# Patient Record
Sex: Female | Born: 1940 | Race: White | Hispanic: No | Smoking: Never smoker
Health system: Southern US, Community
[De-identification: ages and names within clinical notes are randomized; demographics above are authoritative.]

## PROBLEM LIST (undated history)

## (undated) DIAGNOSIS — H269 Unspecified cataract: Secondary | ICD-10-CM

## (undated) DIAGNOSIS — N189 Chronic kidney disease, unspecified: Secondary | ICD-10-CM

## (undated) DIAGNOSIS — G250 Essential tremor: Secondary | ICD-10-CM

## (undated) DIAGNOSIS — M1990 Unspecified osteoarthritis, unspecified site: Secondary | ICD-10-CM

## (undated) DIAGNOSIS — F419 Anxiety disorder, unspecified: Secondary | ICD-10-CM

## (undated) DIAGNOSIS — E785 Hyperlipidemia, unspecified: Secondary | ICD-10-CM

## (undated) HISTORY — DX: Unspecified cataract: H26.9

## (undated) HISTORY — PX: COLONOSCOPY: SHX174

## (undated) HISTORY — PX: OTHER SURGICAL HISTORY: SHX169

## (undated) HISTORY — DX: Hyperlipidemia, unspecified: E78.5

## (undated) HISTORY — PX: CHOLECYSTECTOMY: SHX55

## (undated) HISTORY — DX: Anxiety disorder, unspecified: F41.9

## (undated) HISTORY — PX: TONSILLECTOMY: SUR1361

## (undated) HISTORY — DX: Unspecified osteoarthritis, unspecified site: M19.90

## (undated) HISTORY — DX: Essential tremor: G25.0

---

## 1999-05-30 ENCOUNTER — Other Ambulatory Visit: Admission: RE | Admit: 1999-05-30 | Discharge: 1999-05-30 | Payer: Self-pay | Admitting: Gynecology

## 2002-09-15 ENCOUNTER — Other Ambulatory Visit: Admission: RE | Admit: 2002-09-15 | Discharge: 2002-09-15 | Payer: Self-pay | Admitting: Gynecology

## 2006-10-29 ENCOUNTER — Ambulatory Visit: Payer: Self-pay | Admitting: Endocrinology

## 2006-10-29 LAB — CONVERTED CEMR LAB
Basophils Absolute: 0.3 10*3/uL — ABNORMAL HIGH (ref 0.0–0.1)
Hemoglobin: 12.9 g/dL (ref 12.0–15.0)
INR: 0.9 (ref 0.8–1.0)
MCHC: 34.2 g/dL (ref 30.0–36.0)
Monocytes Absolute: 0.5 10*3/uL (ref 0.2–0.7)
Monocytes Relative: 6.1 % (ref 3.0–11.0)
Neutro Abs: 5.2 10*3/uL (ref 1.4–7.7)
RDW: 12.6 % (ref 11.5–14.6)

## 2006-11-20 ENCOUNTER — Ambulatory Visit: Payer: Self-pay | Admitting: Internal Medicine

## 2006-12-04 ENCOUNTER — Ambulatory Visit: Payer: Self-pay | Admitting: Internal Medicine

## 2007-03-29 LAB — CONVERTED CEMR LAB: Pap Smear: NORMAL

## 2007-12-08 ENCOUNTER — Ambulatory Visit: Payer: Self-pay | Admitting: Endocrinology

## 2008-10-03 ENCOUNTER — Ambulatory Visit: Payer: Self-pay | Admitting: Endocrinology

## 2008-10-03 DIAGNOSIS — R638 Other symptoms and signs concerning food and fluid intake: Secondary | ICD-10-CM | POA: Insufficient documentation

## 2008-10-05 LAB — CONVERTED CEMR LAB
Calcium: 8.7 mg/dL (ref 8.4–10.5)
Chloride: 103 meq/L (ref 96–112)
Creatinine, Ser: 0.7 mg/dL (ref 0.4–1.2)
GFR calc non Af Amer: 88.45 mL/min (ref >60)

## 2013-05-26 DIAGNOSIS — F330 Major depressive disorder, recurrent, mild: Secondary | ICD-10-CM | POA: Diagnosis not present

## 2013-06-30 DIAGNOSIS — N816 Rectocele: Secondary | ICD-10-CM | POA: Diagnosis not present

## 2013-06-30 DIAGNOSIS — Z1231 Encounter for screening mammogram for malignant neoplasm of breast: Secondary | ICD-10-CM | POA: Diagnosis not present

## 2013-06-30 DIAGNOSIS — Z1212 Encounter for screening for malignant neoplasm of rectum: Secondary | ICD-10-CM | POA: Diagnosis not present

## 2013-06-30 DIAGNOSIS — N8111 Cystocele, midline: Secondary | ICD-10-CM | POA: Diagnosis not present

## 2013-10-28 ENCOUNTER — Ambulatory Visit (INDEPENDENT_AMBULATORY_CARE_PROVIDER_SITE_OTHER): Payer: Medicare Other

## 2013-10-28 DIAGNOSIS — Z23 Encounter for immunization: Secondary | ICD-10-CM | POA: Diagnosis not present

## 2014-02-09 DIAGNOSIS — H5203 Hypermetropia, bilateral: Secondary | ICD-10-CM | POA: Diagnosis not present

## 2014-02-09 DIAGNOSIS — H2513 Age-related nuclear cataract, bilateral: Secondary | ICD-10-CM | POA: Diagnosis not present

## 2014-02-09 DIAGNOSIS — H25013 Cortical age-related cataract, bilateral: Secondary | ICD-10-CM | POA: Diagnosis not present

## 2014-06-01 DIAGNOSIS — F331 Major depressive disorder, recurrent, moderate: Secondary | ICD-10-CM | POA: Diagnosis not present

## 2014-07-12 DIAGNOSIS — Z1289 Encounter for screening for malignant neoplasm of other sites: Secondary | ICD-10-CM | POA: Diagnosis not present

## 2014-07-12 DIAGNOSIS — Z1231 Encounter for screening mammogram for malignant neoplasm of breast: Secondary | ICD-10-CM | POA: Diagnosis not present

## 2014-07-13 ENCOUNTER — Other Ambulatory Visit: Payer: Self-pay | Admitting: Obstetrics & Gynecology

## 2014-07-13 DIAGNOSIS — E2839 Other primary ovarian failure: Secondary | ICD-10-CM

## 2014-07-17 ENCOUNTER — Ambulatory Visit
Admission: RE | Admit: 2014-07-17 | Discharge: 2014-07-17 | Disposition: A | Discharge status: Home / Self Care | Payer: Medicare Other | Source: Ambulatory Visit | Attending: Obstetrics & Gynecology | Admitting: Obstetrics & Gynecology

## 2014-07-17 DIAGNOSIS — M85852 Other specified disorders of bone density and structure, left thigh: Secondary | ICD-10-CM | POA: Diagnosis not present

## 2014-07-17 DIAGNOSIS — E2839 Other primary ovarian failure: Secondary | ICD-10-CM

## 2014-08-01 ENCOUNTER — Ambulatory Visit (INDEPENDENT_AMBULATORY_CARE_PROVIDER_SITE_OTHER): Payer: Medicare Other

## 2014-08-01 DIAGNOSIS — Z23 Encounter for immunization: Secondary | ICD-10-CM

## 2014-09-05 DIAGNOSIS — M47816 Spondylosis without myelopathy or radiculopathy, lumbar region: Secondary | ICD-10-CM | POA: Diagnosis not present

## 2014-09-05 DIAGNOSIS — M170 Bilateral primary osteoarthritis of knee: Secondary | ICD-10-CM | POA: Diagnosis not present

## 2014-10-09 ENCOUNTER — Encounter: Payer: Self-pay | Admitting: Endocrinology

## 2014-10-09 ENCOUNTER — Other Ambulatory Visit (INDEPENDENT_AMBULATORY_CARE_PROVIDER_SITE_OTHER): Payer: Medicare Other

## 2014-10-09 ENCOUNTER — Ambulatory Visit (INDEPENDENT_AMBULATORY_CARE_PROVIDER_SITE_OTHER): Payer: Medicare Other | Admitting: Endocrinology

## 2014-10-09 VITALS — BP 128/78 | HR 81 | Temp 97.6°F | Ht 61.0 in | Wt 177.0 lb

## 2014-10-09 DIAGNOSIS — F329 Major depressive disorder, single episode, unspecified: Secondary | ICD-10-CM

## 2014-10-09 DIAGNOSIS — M810 Age-related osteoporosis without current pathological fracture: Secondary | ICD-10-CM | POA: Diagnosis not present

## 2014-10-09 DIAGNOSIS — F32A Depression, unspecified: Secondary | ICD-10-CM

## 2014-10-09 DIAGNOSIS — M170 Bilateral primary osteoarthritis of knee: Secondary | ICD-10-CM

## 2014-10-09 DIAGNOSIS — K589 Irritable bowel syndrome without diarrhea: Secondary | ICD-10-CM

## 2014-10-09 DIAGNOSIS — Z0000 Encounter for general adult medical examination without abnormal findings: Secondary | ICD-10-CM | POA: Diagnosis not present

## 2014-10-09 DIAGNOSIS — E785 Hyperlipidemia, unspecified: Secondary | ICD-10-CM

## 2014-10-09 DIAGNOSIS — M1990 Unspecified osteoarthritis, unspecified site: Secondary | ICD-10-CM | POA: Insufficient documentation

## 2014-10-09 DIAGNOSIS — Z0189 Encounter for other specified special examinations: Secondary | ICD-10-CM

## 2014-10-09 LAB — LIPID PANEL
CHOLESTEROL: 201 mg/dL — AB (ref 0–200)
HDL: 64.6 mg/dL (ref >39.00)
LDL Cholesterol: 117 mg/dL — ABNORMAL HIGH (ref 0–99)
NONHDL: 136.23
Total CHOL/HDL Ratio: 3
Triglycerides: 95 mg/dL (ref 0.0–149.0)
VLDL: 19 mg/dL (ref 0.0–40.0)

## 2014-10-09 LAB — BASIC METABOLIC PANEL
BUN: 11 mg/dL (ref 6–23)
CALCIUM: 8.9 mg/dL (ref 8.4–10.5)
CHLORIDE: 107 meq/L (ref 96–112)
CO2: 27 mEq/L (ref 19–32)
CREATININE: 0.62 mg/dL (ref 0.40–1.20)
GFR: 100.01 mL/min (ref >60.00)
Glucose, Bld: 96 mg/dL (ref 70–99)
Potassium: 3.9 mEq/L (ref 3.5–5.1)
Sodium: 141 mEq/L (ref 135–145)

## 2014-10-09 LAB — VITAMIN D 25 HYDROXY (VIT D DEFICIENCY, FRACTURES): VITD: 24.18 ng/mL — ABNORMAL LOW (ref 30.00–100.00)

## 2014-10-09 LAB — TSH: TSH: 2.67 u[IU]/mL (ref 0.35–4.50)

## 2014-10-09 NOTE — Progress Notes (Signed)
we discussed code status.  pt requests full code, but would not want to be started or maintained on artificial life-support measures if there was not a reasonable chance of recovery 

## 2014-10-09 NOTE — Progress Notes (Signed)
Subjective:    Patient ID: Brittney Miller, female    DOB: Mar 07, 1940, 74 y.o.   MRN: [id number]  HPI  Pot states many years of intermittent slight cramping throughout the abdomen, but no assoc brbpr.  She has been dx'ed with IBS Dyslipidemia: denies chest pain. She has lost a few lbs, due to her efforts. Depression: pt says this is well-controlled on prozac.   No past medical history on file.  No past surgical history on file.  Social History   Social History  . Marital Status: Married    Spouse Name: N/A  . Number of Children: N/A  . Years of Education: N/A   Occupational History  . Not on file.   Social History Main Topics  . Smoking status: Unknown If Ever Smoked  . Smokeless tobacco: Not on file  . Alcohol Use: Not on file  . Drug Use: Not on file  . Sexual Activity: Not on file   Other Topics Concern  . Not on file   Social History Narrative  . No narrative on file    No current outpatient prescriptions on file prior to visit.   No current facility-administered medications on file prior to visit.    Not on File  No family history on file.  BP 128/78 mmHg  Pulse 81  Temp(Src) 97.6 F (36.4 C) (Oral)  Ht 5\' 1"  (1.549 m)  Wt 177 lb (80.287 kg)  BMI 33.46 kg/m2  SpO2 94%  LMP  (LMP Unknown)  Review of Systems Denies weight change and sob.      Objective:   Physical Exam VITAL SIGNS:  See vs page GENERAL: no distress NECK: There is no palpable thyroid enlargement.  No thyroid nodule is palpable.  No palpable lymphadenopathy at the anterior neck. LUNGS:  Clear to auscultation HEART:  Regular rate and rhythm without murmurs noted. Normal S1,S2.   Gait: normal and steady.   i personally reviewed electrocardiogram tracing: normal Lab Results  Component Value Date   CHOL 201* 10/09/2014   HDL 64.60 10/09/2014   LDLCALC 117* 10/09/2014   TRIG 95.0 10/09/2014   CHOLHDL 3 10/09/2014      Assessment & Plan:  IBS, new to me: i offered rx.  Pt  says she'll consider Dyslipidemia: she should take medication. Depression: well-controlled.  Please continue the same medication.   Subjective:   Patient here for Medicare annual wellness visit and management of other chronic and acute problems.     Risk factors: advanced age.    Roster of Physicians Providing Medical Care to Patient:  See "snapshot"   Activities of Daily Living: In your present state of health, do you have any difficulty performing the following activities?:  Preparing food and eating?: No  Bathing yourself: No  Getting dressed: No  Using the toilet: No  Moving around from place to place: No  In the past year have you fallen or had a near fall?: No    Home Safety: Has smoke detector and wears seat belts. No firearms. No excess sun exposure.  Diet and Exercise  Current exercise habits: pt says "ok." Dietary issues discussed: pt reports a healthy diet   Depression Screen  Q1: Over the past two weeks, have you felt down, depressed or hopeless? no Q2: Over the past two weeks, have you felt little interest or pleasure in doing things? no   The following portions of the patient's history were reviewed and updated as appropriate: allergies, current medications, past family  history, past medical history, past social history, past surgical history and problem list.   Review of Systems  Denies hearing loss, and visual loss Objective:   Vision:  Advertising account executive, but does not recall name Hearing: grossly normal Body mass index:  See vs page Msk: pt easily and quickly performs "get-up-and-go" from a sitting position Cognitive Impairment Assessment: cognition, memory and judgment appear normal.  remembers 3/3 at 5 minutes.  excellent recall.  can easily read and write a sentence.  alert and oriented x 3.     Assessment:   Medicare wellness utd on preventive parameters    Plan:   During the course of the visit the patient was educated and counseled about  appropriate screening and preventive services including:        Fall prevention   Screening mammography--GYN does Bone densitometry screening--GYN does  Diabetes screening  Nutrition counseling   Vaccines / LABS Zostavax / Pneumococcal Vaccine  today   Patient Instructions (the written plan) was given to the patient.

## 2014-10-09 NOTE — Patient Instructions (Addendum)
please consider these measures for your health:  minimize alcohol.  do not use tobacco products.  have a colonoscopy at least every 10 years from age 74.  Women should have an annual mammogram from age 40.  keep firearms safely stored.  always use seat belts.  have working smoke alarms in your home.  see an eye doctor and dentist regularly.  never drive under the influence of alcohol or drugs (including prescription drugs).  those with fair skin should take precautions against the sun. please let me know what your wishes would be, if artificial life support measures should become necessary.  it is critically important to prevent falling down (keep floor areas well-lit, dry, and free of loose objects.  If you have a cane, walker, or wheelchair, you should use it, even for short trips around the house.  Also, try not to rush). blood tests are requested for you today.  We'll let you know about the results. good diet and exercise significantly improve your health.  please let me know if you wish to be referred to a dietician.  high blood sugar is very risky to your health.  you should see an eye doctor and dentist every year.  It is very important to get all recommended vaccinations.  Please return in 1 year.   Please let me know if you want medication for the stomach cramps.

## 2014-10-10 LAB — PTH, INTACT AND CALCIUM
CALCIUM: 9 mg/dL (ref 8.4–10.5)
PTH: 46 pg/mL (ref 14–64)

## 2014-11-01 ENCOUNTER — Ambulatory Visit (INDEPENDENT_AMBULATORY_CARE_PROVIDER_SITE_OTHER): Payer: Medicare Other

## 2014-11-01 DIAGNOSIS — Z23 Encounter for immunization: Secondary | ICD-10-CM | POA: Diagnosis not present

## 2015-01-17 ENCOUNTER — Encounter (HOSPITAL_COMMUNITY): Payer: Self-pay | Admitting: Emergency Medicine

## 2015-01-17 ENCOUNTER — Emergency Department (INDEPENDENT_AMBULATORY_CARE_PROVIDER_SITE_OTHER)
Admission: EM | Admit: 2015-01-17 | Discharge: 2015-01-17 | Disposition: A | Discharge status: Home / Self Care | Payer: Medicare Other | Source: Home / Self Care

## 2015-01-17 DIAGNOSIS — S61512A Laceration without foreign body of left wrist, initial encounter: Secondary | ICD-10-CM

## 2015-01-17 MED ORDER — PREDNISONE 10 MG PO TABS: 30.0000 mg | ORAL_TABLET | Freq: Every day | ORAL | Status: DC

## 2015-01-17 NOTE — ED Provider Notes (Signed)
CSN: [id number]     Arrival date & time 01/17/15  1734 History   None    Chief Complaint  Patient presents with  . Laceration   (Consider location/radiation/quality/duration/timing/severity/associated sxs/prior Treatment) The history is provided by the patient. No language interpreter was used.  History obtained from patient:   LOCATION: Left wrist SEVERITY: None DURATION: Less than one hour CONTEXT: Cutting [place] QUALITY: MODIFYING FACTORS: Wound cleaned at home ASSOCIATED SYMPTOMS: None TIMING: Constant     History reviewed. No pertinent past medical history. History reviewed. No pertinent past surgical history. History reviewed. No pertinent family history. Social History  Substance Use Topics  . Smoking status: Unknown If Ever Smoked  . Smokeless tobacco: None  . Alcohol Use: None   OB History    No data available     Review of Systems Positive for wrist laceration Negative for chest pain, shortness of breath, neurologic symptoms. Allergies  Review of patient's allergies indicates no known allergies.  Home Medications   Prior to Admission medications   Medication Sig Start Date End Date Taking? Authorizing Provider  FLUoxetine (PROZAC) 20 MG capsule  08/22/14   Historical Provider, MD  predniSONE (DELTASONE) 10 MG tablet Take 3 tablets (30 mg total) by mouth daily. 01/17/15   Konrad Felix, PA   Meds Ordered and Administered this Visit  Medications - No data to display  BP 154/83 mmHg  Pulse 69  Temp(Src) 99.7 F (37.6 C) (Oral)  Resp 16  SpO2 96%  LMP  (LMP Unknown) No data found.   Physical Exam  Constitutional: She appears well-developed and well-nourished.  Musculoskeletal: She exhibits tenderness.       Arms: Nursing note and vitals reviewed.   ED Course  Procedures (including critical care time) Wound glue and Steri-Strips were placed over the wound. Sterile dressing was placed by nursing staff. Labs Review Labs Reviewed - No data  to display  Imaging Review No results found.   Visual Acuity Review  Right Eye Distance:   Left Eye Distance:   Bilateral Distance:    Right Eye Near:   Left Eye Near:    Bilateral Near:         MDM   1. Wrist laceration, left, initial encounter    Instructions of care provided discharged home in stable condition    Konrad Felix, [place] 01/17/15 2056

## 2015-01-17 NOTE — Discharge Instructions (Signed)
Laceration Care, Adult A laceration is a cut that goes through all layers of the skin. The cut also goes into the tissue that is right under the skin. Some cuts heal on their own. Others need to be closed with stitches (sutures), staples, skin adhesive strips, or wound glue. Taking care of your cut lowers your risk of infection and helps your cut to heal better. HOW TO TAKE CARE OF YOUR CUT For stitches or staples:  Keep the wound clean and dry.  If you were given a bandage (dressing), you should change it at least one time per day or as told by your doctor. You should also change it if it gets wet or dirty.  Keep the wound completely dry for the first 24 hours or as told by your doctor. After that time, you may take a shower or a bath. However, make sure that the wound is not soaked in water until after the stitches or staples have been removed.  Clean the wound one time each day or as told by your doctor:  Wash the wound with soap and water.  Rinse the wound with water until all of the soap comes off.  Pat the wound dry with a clean towel. Do not rub the wound.  After you clean the wound, put a thin layer of antibiotic ointment on it as told by your doctor. This ointment:  Helps to prevent infection.  Keeps the bandage from sticking to the wound.  Have your stitches or staples removed as told by your doctor. If your doctor used skin adhesive strips:   Keep the wound clean and dry.  If you were given a bandage, you should change it at least one time per day or as told by your doctor. You should also change it if it gets dirty or wet.  Do not get the skin adhesive strips wet. You can take a shower or a bath, but be careful to keep the wound dry.  If the wound gets wet, pat it dry with a clean towel. Do not rub the wound.  Skin adhesive strips fall off on their own. You can trim the strips as the wound heals. Do not remove any strips that are still stuck to the wound. They will  fall off after a while. If your doctor used wound glue:  Try to keep your wound dry, but you may briefly wet it in the shower or bath. Do not soak the wound in water, such as by swimming.  After you take a shower or a bath, gently pat the wound dry with a clean towel. Do not rub the wound.  Do not do any activities that will make you really sweaty until the skin glue has fallen off on its own.  Do not apply liquid, cream, or ointment medicine to your wound while the skin glue is still on.  If you were given a bandage, you should change it at least one time per day or as told by your doctor. You should also change it if it gets dirty or wet.  If a bandage is placed over the wound, do not let the tape for the bandage touch the skin glue.  Do not pick at the glue. The skin glue usually stays on for 5-10 days. Then, it falls off of the skin. General Instructions  To help prevent scarring, make sure to cover your wound with sunscreen whenever you are outside after stitches are removed, after adhesive strips are removed,  or when wound glue stays in place and the wound is healed. Make sure to wear a sunscreen of at least 30 SPF.  Take over-the-counter and prescription medicines only as told by your doctor.  If you were given antibiotic medicine or ointment, take or apply it as told by your doctor. Do not stop using the antibiotic even if your wound is getting better.  Do not scratch or pick at the wound.  Keep all follow-up visits as told by your doctor. This is important.  Check your wound every day for signs of infection. Watch for:  Redness, swelling, or pain.  Fluid, blood, or pus.  Raise (elevate) the injured area above the level of your heart while you are sitting or lying down, if possible. GET HELP IF:  You got a tetanus shot and you have any of these problems at the injection site:  Swelling.  Very bad pain.  Redness.  Bleeding.  You have a fever.  A wound that was  closed breaks open.  You notice a bad smell coming from your wound or your bandage.  You notice something coming out of the wound, such as wood or glass.  Medicine does not help your pain.  You have more redness, swelling, or pain at the site of your wound.  You have fluid, blood, or pus coming from your wound.  You notice a change in the color of your skin near your wound.  You need to change the bandage often because fluid, blood, or pus is coming from the wound.  You start to have a new rash.  You start to have numbness around the wound. GET HELP RIGHT AWAY IF:  You have very bad swelling around the wound.  Your pain suddenly gets worse and is very bad.  You notice painful lumps near the wound or on skin that is anywhere on your body.  You have a red streak going away from your wound.  The wound is on your hand or foot and you cannot move a finger or toe like you usually can.  The wound is on your hand or foot and you notice that your fingers or toes look pale or bluish.   This information is not intended to replace advice given to you by your health care provider. Make sure you discuss any questions you have with your health care provider.   Document Released: 07/30/2007 Document Revised: 06/27/2014 Document Reviewed: 02/06/2014 Elsevier Interactive Patient Education 2016 [place]. Sterile Tape Wound Care Some cuts and wounds can be closed using sterile tape, also called skin adhesive strips. Skin adhesive strips can be used for shallow (superficial) and simple cuts, wounds, lacerations, and surgical incisions. These strips act in place of stitches to hold the edges of the wound together, allowing for faster healing. Unlike stitches, the adhesive strips do not require needles or anesthetic medicine for placement. The strips will wear off naturally as the wound is healing. It is important to take proper care of your wound at home while it heals.  HOME CARE  INSTRUCTIONS  Try to keep the area around your wound clean and dry. Do not allow the adhesive strips to get wet for the first 12 hours.   Do not use any soaps or ointments on the wound for the first 12 hours.   If a bandage (dressing) has been applied, follow your health care provider's instructions for how often to change the dressing. Keep the dressing dry if one has been applied.  Do not remove the adhesive strips. They will fall off on their own. If they do not, you may remove them gently after 10 days. You should gently wet the strips before removing them. For example, this can be done in the shower.  Do not scratch, pick, or rub the wound area.   Protect the wound from further injury until it is healed.   Protect the wound from sun and tanning bed exposure while it is healing and for several weeks after healing.   Only take over-the-counter or prescription medicines as directed by your health care provider.   Keep all follow-up appointments as directed by your health care provider.  SEEK MEDICAL CARE IF: Your adhesive strips become wet or soaked with blood before the wound has healed. The tape will need to be replaced.  SEEK IMMEDIATE MEDICAL CARE IF:  You have increasing pain in the wound.   You develop a rash after the strips are applied.  Your wound becomes red, swollen, hot, or tender.   You have a red streak that goes away from the wound.   You have pus coming from the wound.   You have increased bleeding from the wound.  You notice a bad smell coming from the wound.   Your wound breaks open. MAKE SURE YOU:  Understand these instructions.  Will watch your condition.  Will get help right away if you are not doing well or get worse.   This information is not intended to replace advice given to you by your health care provider. Make sure you discuss any questions you have with your health care provider.   Document Released: 03/20/2004 Document  Revised: 03/03/2014 Document Reviewed: 09/01/2012 Elsevier Interactive Patient Education [employer].

## 2015-01-17 NOTE — ED Notes (Signed)
The patient presented to the [facility] with a complaint of a laceration to her left wrist that happened when she was cutting food.

## 2015-03-19 DIAGNOSIS — H2513 Age-related nuclear cataract, bilateral: Secondary | ICD-10-CM | POA: Diagnosis not present

## 2015-03-19 DIAGNOSIS — Z0100 Encounter for examination of eyes and vision without abnormal findings: Secondary | ICD-10-CM | POA: Diagnosis not present

## 2015-03-19 DIAGNOSIS — H25013 Cortical age-related cataract, bilateral: Secondary | ICD-10-CM | POA: Diagnosis not present

## 2015-06-06 DIAGNOSIS — F331 Major depressive disorder, recurrent, moderate: Secondary | ICD-10-CM | POA: Diagnosis not present

## 2015-10-10 DIAGNOSIS — F331 Major depressive disorder, recurrent, moderate: Secondary | ICD-10-CM | POA: Diagnosis not present

## 2015-11-07 DIAGNOSIS — Z23 Encounter for immunization: Secondary | ICD-10-CM | POA: Diagnosis not present

## 2016-04-28 DIAGNOSIS — H5203 Hypermetropia, bilateral: Secondary | ICD-10-CM | POA: Diagnosis not present

## 2016-04-28 DIAGNOSIS — H2513 Age-related nuclear cataract, bilateral: Secondary | ICD-10-CM | POA: Diagnosis not present

## 2016-04-28 DIAGNOSIS — H25013 Cortical age-related cataract, bilateral: Secondary | ICD-10-CM | POA: Diagnosis not present

## 2016-05-15 DIAGNOSIS — F331 Major depressive disorder, recurrent, moderate: Secondary | ICD-10-CM | POA: Diagnosis not present

## 2016-06-24 DIAGNOSIS — H2511 Age-related nuclear cataract, right eye: Secondary | ICD-10-CM | POA: Diagnosis not present

## 2016-06-24 DIAGNOSIS — H25011 Cortical age-related cataract, right eye: Secondary | ICD-10-CM | POA: Diagnosis not present

## 2016-06-24 DIAGNOSIS — H25811 Combined forms of age-related cataract, right eye: Secondary | ICD-10-CM | POA: Diagnosis not present

## 2016-07-16 ENCOUNTER — Other Ambulatory Visit: Payer: Self-pay | Admitting: Obstetrics & Gynecology

## 2016-07-16 DIAGNOSIS — L900 Lichen sclerosus et atrophicus: Secondary | ICD-10-CM | POA: Diagnosis not present

## 2016-07-16 DIAGNOSIS — Z1231 Encounter for screening mammogram for malignant neoplasm of breast: Secondary | ICD-10-CM | POA: Diagnosis not present

## 2016-07-16 DIAGNOSIS — N816 Rectocele: Secondary | ICD-10-CM | POA: Diagnosis not present

## 2016-07-16 DIAGNOSIS — E2839 Other primary ovarian failure: Secondary | ICD-10-CM

## 2016-07-25 ENCOUNTER — Other Ambulatory Visit: Payer: Medicare Other

## 2016-07-29 DIAGNOSIS — H2512 Age-related nuclear cataract, left eye: Secondary | ICD-10-CM | POA: Diagnosis not present

## 2016-07-29 DIAGNOSIS — H25812 Combined forms of age-related cataract, left eye: Secondary | ICD-10-CM | POA: Diagnosis not present

## 2016-07-29 DIAGNOSIS — H25012 Cortical age-related cataract, left eye: Secondary | ICD-10-CM | POA: Diagnosis not present

## 2016-11-12 DIAGNOSIS — Z23 Encounter for immunization: Secondary | ICD-10-CM | POA: Diagnosis not present

## 2016-12-29 ENCOUNTER — Encounter: Payer: Self-pay | Admitting: Gastroenterology

## 2017-06-19 ENCOUNTER — Telehealth: Payer: Self-pay

## 2017-06-19 NOTE — Telephone Encounter (Signed)
Last ov was 10/09/14- patient is requesting an appointment to have neck pain looked at and have a CPE is it ok to schedule her please advise

## 2017-06-19 NOTE — Telephone Encounter (Signed)
OK next available.

## 2017-06-22 NOTE — Telephone Encounter (Signed)
I left detailed VM for patient to call back to make f/u appt & CPE.

## 2017-07-08 ENCOUNTER — Ambulatory Visit
Admission: RE | Admit: 2017-07-08 | Discharge: 2017-07-08 | Disposition: A | Discharge status: Home / Self Care | Payer: Medicare Other | Source: Ambulatory Visit | Attending: Obstetrics & Gynecology | Admitting: Obstetrics & Gynecology

## 2017-07-08 DIAGNOSIS — Z780 Asymptomatic menopausal state: Secondary | ICD-10-CM | POA: Diagnosis not present

## 2017-07-08 DIAGNOSIS — M8589 Other specified disorders of bone density and structure, multiple sites: Secondary | ICD-10-CM | POA: Diagnosis not present

## 2017-07-08 DIAGNOSIS — E2839 Other primary ovarian failure: Secondary | ICD-10-CM

## 2017-07-09 ENCOUNTER — Ambulatory Visit (INDEPENDENT_AMBULATORY_CARE_PROVIDER_SITE_OTHER): Payer: Medicare Other | Admitting: Endocrinology

## 2017-07-09 ENCOUNTER — Encounter: Payer: Self-pay | Admitting: Endocrinology

## 2017-07-09 VITALS — BP 122/70 | HR 65 | Ht 61.0 in | Wt 162.0 lb

## 2017-07-09 DIAGNOSIS — M810 Age-related osteoporosis without current pathological fracture: Secondary | ICD-10-CM

## 2017-07-09 DIAGNOSIS — F329 Major depressive disorder, single episode, unspecified: Secondary | ICD-10-CM | POA: Diagnosis not present

## 2017-07-09 DIAGNOSIS — Z0000 Encounter for general adult medical examination without abnormal findings: Secondary | ICD-10-CM | POA: Diagnosis not present

## 2017-07-09 DIAGNOSIS — F32A Depression, unspecified: Secondary | ICD-10-CM

## 2017-07-09 DIAGNOSIS — M542 Cervicalgia: Secondary | ICD-10-CM | POA: Diagnosis not present

## 2017-07-09 DIAGNOSIS — R4789 Other speech disturbances: Secondary | ICD-10-CM | POA: Insufficient documentation

## 2017-07-09 DIAGNOSIS — E785 Hyperlipidemia, unspecified: Secondary | ICD-10-CM

## 2017-07-09 DIAGNOSIS — Z0001 Encounter for general adult medical examination with abnormal findings: Secondary | ICD-10-CM | POA: Diagnosis not present

## 2017-07-09 DIAGNOSIS — Z23 Encounter for immunization: Secondary | ICD-10-CM | POA: Diagnosis not present

## 2017-07-09 DIAGNOSIS — R479 Unspecified speech disturbances: Secondary | ICD-10-CM

## 2017-07-09 LAB — CBC WITH DIFFERENTIAL/PLATELET
Basophils Absolute: 0.1 10*3/uL (ref 0.0–0.1)
Basophils Relative: 1.4 % (ref 0.0–3.0)
EOS PCT: 4 % (ref 0.0–5.0)
Eosinophils Absolute: 0.2 10*3/uL (ref 0.0–0.7)
HEMATOCRIT: 38 % (ref 36.0–46.0)
HEMOGLOBIN: 12.8 g/dL (ref 12.0–15.0)
LYMPHS PCT: 26.8 % (ref 12.0–46.0)
Lymphs Abs: 1.6 10*3/uL (ref 0.7–4.0)
MCHC: 33.7 g/dL (ref 30.0–36.0)
MCV: 90.8 fl (ref 78.0–100.0)
MONOS PCT: 8.4 % (ref 3.0–12.0)
Monocytes Absolute: 0.5 10*3/uL (ref 0.1–1.0)
Neutro Abs: 3.5 10*3/uL (ref 1.4–7.7)
Neutrophils Relative %: 59.4 % (ref 43.0–77.0)
Platelets: 330 10*3/uL (ref 150.0–400.0)
RBC: 4.19 Mil/uL (ref 3.87–5.11)
RDW: 13.6 % (ref 11.5–15.5)
WBC: 5.9 10*3/uL (ref 4.0–10.5)

## 2017-07-09 LAB — LIPID PANEL
CHOL/HDL RATIO: 3
CHOLESTEROL: 201 mg/dL — AB (ref 0–200)
HDL: 69.3 mg/dL (ref >39.00)
LDL CALC: 113 mg/dL — AB (ref 0–99)
NonHDL: 131.21
TRIGLYCERIDES: 92 mg/dL (ref 0.0–149.0)
VLDL: 18.4 mg/dL (ref 0.0–40.0)

## 2017-07-09 LAB — URINALYSIS, ROUTINE W REFLEX MICROSCOPIC
BILIRUBIN URINE: NEGATIVE
KETONES UR: NEGATIVE
Nitrite: NEGATIVE
RBC / HPF: NONE SEEN (ref 0–?)
Specific Gravity, Urine: 1.015 (ref 1.000–1.030)
TOTAL PROTEIN, URINE-UPE24: NEGATIVE
Urine Glucose: NEGATIVE
Urobilinogen, UA: 0.2 (ref 0.0–1.0)
pH: 6 (ref 5.0–8.0)

## 2017-07-09 LAB — HEPATIC FUNCTION PANEL
ALT: 7 U/L (ref 0–35)
AST: 11 U/L (ref 0–37)
Albumin: 3.9 g/dL (ref 3.5–5.2)
Alkaline Phosphatase: 70 U/L (ref 39–117)
Bilirubin, Direct: 0.1 mg/dL (ref 0.0–0.3)
Total Bilirubin: 0.3 mg/dL (ref 0.2–1.2)
Total Protein: 7 g/dL (ref 6.0–8.3)

## 2017-07-09 LAB — TSH: TSH: 3.72 u[IU]/mL (ref 0.35–4.50)

## 2017-07-09 LAB — BASIC METABOLIC PANEL
BUN: 14 mg/dL (ref 6–23)
CHLORIDE: 103 meq/L (ref 96–112)
CO2: 28 meq/L (ref 19–32)
Calcium: 8.8 mg/dL (ref 8.4–10.5)
Creatinine, Ser: 0.6 mg/dL (ref 0.40–1.20)
GFR: 103.1 mL/min (ref >60.00)
Glucose, Bld: 97 mg/dL (ref 70–99)
POTASSIUM: 4 meq/L (ref 3.5–5.1)
SODIUM: 137 meq/L (ref 135–145)

## 2017-07-09 LAB — VITAMIN D 25 HYDROXY (VIT D DEFICIENCY, FRACTURES): VITD: 43.33 ng/mL (ref 30.00–100.00)

## 2017-07-09 NOTE — Progress Notes (Signed)
we discussed code status.  pt requests full code, but would not want to be started or maintained on artificial life-support measures if there was not a reasonable chance of recovery 

## 2017-07-09 NOTE — Progress Notes (Signed)
Subjective:    Patient ID: Brittney Miller, female    DOB: 1940/06/26, 77 y.o.   MRN: [id number]  HPI Pt reports few years of intermitt moderate pain at the right post neck.  No assoc numbness Past Medical History:  Diagnosis Date  . Dyslipidemia     No past surgical history on file.  Social History   Socioeconomic History  . Marital status: Married    Spouse name: Not on file  . Number of children: Not on file  . Years of education: Not on file  . Highest education level: Not on file  Occupational History  . Not on file  Social Needs  . Financial resource strain: Not on file  . Food insecurity:    Worry: Not on file    Inability: Not on file  . Transportation needs:    Medical: Not on file    Non-medical: Not on file  Tobacco Use  . Smoking status: Unknown If Ever Smoked  . Smokeless tobacco: Never Used  Substance and Sexual Activity  . Alcohol use: Not on file  . Drug use: Not on file  . Sexual activity: Not on file  Lifestyle  . Physical activity:    Days per week: Not on file    Minutes per session: Not on file  . Stress: Not on file  Relationships  . Social connections:    Talks on phone: Not on file    Gets together: Not on file    Attends religious service: Not on file    Active member of club or organization: Not on file    Attends meetings of clubs or organizations: Not on file    Relationship status: Not on file  . Intimate partner violence:    Fear of current or ex partner: Not on file    Emotionally abused: Not on file    Physically abused: Not on file    Forced sexual activity: Not on file  Other Topics Concern  . Not on file  Social History Narrative  . Not on file    Current Outpatient Medications on File Prior to Visit  Medication Sig Dispense Refill  . FLUoxetine (PROZAC) 20 MG capsule      No current facility-administered medications on file prior to visit.     No Known Allergies  No family history on file.  BP 122/70    Pulse 65   Ht 5\' 1"  (1.549 m)   Wt 162 lb (73.5 kg)   LMP  (LMP Unknown)   SpO2 97%   BMI 30.61 kg/m   Review of Systems Denies headache.  She has intermitt difficulty with word finding    Objective:   Physical Exam VS: see vs page GEN: no distress HEAD: head: no deformity eyes: no periorbital swelling, no proptosis external nose and ears are normal mouth: no lesion seen NECK: supple, thyroid is not enlarged.  Full ROM without pain.  CHEST WALL: no deformity LUNGS: clear to auscultation CV: reg rate and rhythm, no murmur MUSCULOSKELETAL: muscle bulk and strength are grossly normal.  no obvious joint swelling.  gait is normal and steady EXTEMITIES: no deformity.  no edema PULSES: no carotid bruit NEURO:  cn 2-12 grossly intact, except speech is slightly slurred.   readily moves all 4's.  sensation is intact to touch on all 4's.  Slight difficulty with word finding.  SKIN:  Normal texture and temperature.  No rash or suspicious lesion is visible.   NODES:  None palpable at the neck   I personally reviewed electrocardiogram tracing (today): Indication: dyslipidemia Impression: NSR.  No MI.  LVH Compared to 2016: no significant change     Assessment & Plan:  Word finding difficulty, new: check MRI Dyslipidemia: recheck today Neck pain: we discussed.  Call if persists  Subjective:   Patient here for Medicare annual wellness visit and management of other chronic and acute problems.     Risk factors: advanced age    12 of Physicians Providing Medical Care to Patient:  See "snapshot"   Activities of Daily Living: In your present state of health, do you have any difficulty performing the following activities (lives with husband)?:  Preparing food and eating?: No  Bathing yourself: No  Getting dressed: No  Using the toilet:No  Moving around from place to place: No  In the past year have you fallen or had a near fall?:No    Home Safety: Has smoke detector and wears  seat belts. No firearms. No excess sun exposure.    Opioid Use: none   Diet and Exercise  Current exercise habits: pt says fair Dietary issues discussed: pt reports a healthy diet   Depression Screen  Q1: Over the past two weeks, have you felt down, depressed or hopeless? Depression is well-controlled on rx Q2: Over the past two weeks, have you felt little interest or pleasure in doing things? no   The following portions of the patient's history were reviewed and updated as appropriate: allergies, current medications, past family history, past medical history, past social history, past surgical history and problem list.   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   Hearing: grossly normal.   Body mass index:  See vs page Msk: pt easily and quickly performs "get-up-and-go" from a sitting position Cognitive Impairment Assessment: cognition, memory and judgment appear normal.  remembers 3/3 at 5 minutes.  excellent recall.  can easily read and write a sentence.  alert and oriented x 3.     Assessment:   Medicare wellness utd on preventive parameters    Plan:   During the course of the visit the patient was educated and counseled about appropriate screening and preventive services including:        Fall prevention is advised today  Screening mammography is UTD per pt.  Bone densitometry screening is ordered Diabetes screening  Nutrition counseling is offered  advanced directives/end of life addressed today:  see healthcare directives hyperlink  Vaccines are updated as needed  Patient Instructions (the written plan) was given to the patient.

## 2017-07-09 NOTE — Patient Instructions (Addendum)
blood tests are requested for you today.  We'll let you know about the results. Let's check the MRI.  you will receive a phone call, about a day and time for an appointment. Please consider these measures for your health:  minimize alcohol.  Do not use tobacco products.  Have a colonoscopy at least every 10 years from age 76.  Women should have an annual mammogram from age 53.  Keep firearms safely stored.  Always use seat belts.  have working smoke alarms in your home.  See an eye doctor and dentist regularly.  Never drive under the influence of alcohol or drugs (including prescription drugs).  Those with fair skin should take precautions against the sun, and should carefully examine their skin once per month, for any new or changed moles. It is critically important to prevent falling down (keep floor areas well-lit, dry, and free of loose objects.  If you have a cane, walker, or wheelchair, you should use it, even for short trips around the house.  Wear flat-soled shoes.  Also, try not to rush).

## 2017-07-11 ENCOUNTER — Encounter: Payer: Self-pay | Admitting: Endocrinology

## 2017-07-13 LAB — PTH, INTACT AND CALCIUM
Calcium: 9 mg/dL (ref 8.6–10.4)
PTH: 56 pg/mL (ref 14–64)

## 2017-07-24 DIAGNOSIS — Z1231 Encounter for screening mammogram for malignant neoplasm of breast: Secondary | ICD-10-CM | POA: Diagnosis not present

## 2017-07-30 DIAGNOSIS — F331 Major depressive disorder, recurrent, moderate: Secondary | ICD-10-CM | POA: Diagnosis not present

## 2017-08-18 ENCOUNTER — Other Ambulatory Visit: Payer: Medicare Other

## 2017-09-09 DIAGNOSIS — Z961 Presence of intraocular lens: Secondary | ICD-10-CM | POA: Diagnosis not present

## 2017-09-09 DIAGNOSIS — H524 Presbyopia: Secondary | ICD-10-CM | POA: Diagnosis not present

## 2017-09-09 DIAGNOSIS — H5213 Myopia, bilateral: Secondary | ICD-10-CM | POA: Diagnosis not present

## 2017-11-28 DIAGNOSIS — Z23 Encounter for immunization: Secondary | ICD-10-CM | POA: Diagnosis not present

## 2018-01-05 DIAGNOSIS — S0101XA Laceration without foreign body of scalp, initial encounter: Secondary | ICD-10-CM | POA: Diagnosis not present

## 2018-01-12 DIAGNOSIS — S0101XA Laceration without foreign body of scalp, initial encounter: Secondary | ICD-10-CM | POA: Diagnosis not present

## 2018-01-12 DIAGNOSIS — S0101XS Laceration without foreign body of scalp, sequela: Secondary | ICD-10-CM | POA: Diagnosis not present

## 2018-05-05 ENCOUNTER — Ambulatory Visit: Payer: Medicare Other | Admitting: Nurse Practitioner

## 2018-05-10 ENCOUNTER — Ambulatory Visit (INDEPENDENT_AMBULATORY_CARE_PROVIDER_SITE_OTHER): Payer: Medicare Other | Admitting: Family

## 2018-05-10 ENCOUNTER — Other Ambulatory Visit: Payer: Self-pay

## 2018-05-10 ENCOUNTER — Other Ambulatory Visit (INDEPENDENT_AMBULATORY_CARE_PROVIDER_SITE_OTHER): Payer: Medicare Other

## 2018-05-10 ENCOUNTER — Ambulatory Visit (INDEPENDENT_AMBULATORY_CARE_PROVIDER_SITE_OTHER)
Admission: RE | Admit: 2018-05-10 | Discharge: 2018-05-10 | Disposition: A | Discharge status: Home / Self Care | Payer: Medicare Other | Source: Ambulatory Visit | Attending: Family | Admitting: Family

## 2018-05-10 ENCOUNTER — Encounter: Payer: Self-pay | Admitting: Family

## 2018-05-10 VITALS — BP 116/68 | HR 60 | Temp 97.9°F | Ht 61.0 in | Wt 161.0 lb

## 2018-05-10 DIAGNOSIS — R531 Weakness: Secondary | ICD-10-CM

## 2018-05-10 DIAGNOSIS — R600 Localized edema: Secondary | ICD-10-CM

## 2018-05-10 DIAGNOSIS — R29818 Other symptoms and signs involving the nervous system: Secondary | ICD-10-CM | POA: Diagnosis not present

## 2018-05-10 DIAGNOSIS — R200 Anesthesia of skin: Secondary | ICD-10-CM

## 2018-05-10 DIAGNOSIS — R159 Full incontinence of feces: Secondary | ICD-10-CM | POA: Diagnosis not present

## 2018-05-10 DIAGNOSIS — R296 Repeated falls: Secondary | ICD-10-CM

## 2018-05-10 DIAGNOSIS — M79605 Pain in left leg: Secondary | ICD-10-CM | POA: Diagnosis not present

## 2018-05-10 DIAGNOSIS — K625 Hemorrhage of anus and rectum: Secondary | ICD-10-CM

## 2018-05-10 DIAGNOSIS — K449 Diaphragmatic hernia without obstruction or gangrene: Secondary | ICD-10-CM | POA: Diagnosis not present

## 2018-05-10 LAB — COMPREHENSIVE METABOLIC PANEL
ALT: 9 U/L (ref 0–35)
AST: 15 U/L (ref 0–37)
Albumin: 3.9 g/dL (ref 3.5–5.2)
Alkaline Phosphatase: 77 U/L (ref 39–117)
BUN: 14 mg/dL (ref 6–23)
CO2: 27 meq/L (ref 19–32)
Calcium: 9.1 mg/dL (ref 8.4–10.5)
Chloride: 104 mEq/L (ref 96–112)
Creatinine, Ser: 0.64 mg/dL (ref 0.40–1.20)
GFR: 89.84 mL/min (ref >60.00)
GLUCOSE: 105 mg/dL — AB (ref 70–99)
Potassium: 4.2 mEq/L (ref 3.5–5.1)
SODIUM: 138 meq/L (ref 135–145)
Total Bilirubin: 0.4 mg/dL (ref 0.2–1.2)
Total Protein: 6.9 g/dL (ref 6.0–8.3)

## 2018-05-10 LAB — CBC WITH DIFFERENTIAL/PLATELET
Basophils Absolute: 0.1 10*3/uL (ref 0.0–0.1)
Basophils Relative: 1.2 % (ref 0.0–3.0)
Eosinophils Absolute: 0.2 10*3/uL (ref 0.0–0.7)
Eosinophils Relative: 2.5 % (ref 0.0–5.0)
HCT: 38.1 % (ref 36.0–46.0)
Hemoglobin: 12.8 g/dL (ref 12.0–15.0)
LYMPHS ABS: 1.5 10*3/uL (ref 0.7–4.0)
Lymphocytes Relative: 22.8 % (ref 12.0–46.0)
MCHC: 33.7 g/dL (ref 30.0–36.0)
MCV: 90.6 fl (ref 78.0–100.0)
Monocytes Absolute: 0.5 10*3/uL (ref 0.1–1.0)
Monocytes Relative: 7.3 % (ref 3.0–12.0)
Neutro Abs: 4.3 10*3/uL (ref 1.4–7.7)
Neutrophils Relative %: 66.2 % (ref 43.0–77.0)
Platelets: 310 10*3/uL (ref 150.0–400.0)
RBC: 4.21 Mil/uL (ref 3.87–5.11)
RDW: 14 % (ref 11.5–15.5)
WBC: 6.5 10*3/uL (ref 4.0–10.5)

## 2018-05-10 LAB — VITAMIN B12: Vitamin B-12: 686 pg/mL (ref 211–911)

## 2018-05-10 LAB — BRAIN NATRIURETIC PEPTIDE: PRO B NATRI PEPTIDE: 182 pg/mL — AB (ref 0.0–100.0)

## 2018-05-10 LAB — TSH: TSH: 2.23 u[IU]/mL (ref 0.35–4.50)

## 2018-05-10 NOTE — Progress Notes (Signed)
Brittney Miller is a 78 y.o. female with the following history as recorded in [employer]:  Patient Active Problem List   Diagnosis Date Noted  . Word finding difficulty 07/09/2017  . Wellness examination 10/09/2014  . Depression 10/09/2014  . Dyslipidemia 10/09/2014  . Osteoporosis 10/09/2014  . IBS (irritable bowel syndrome) 10/09/2014  . Osteoarthritis 10/09/2014  . THIRST 10/03/2008    Current Outpatient Medications  Medication Sig Dispense Refill  . FLUoxetine (PROZAC) 40 MG capsule TK ONE C PO QAM     No current facility-administered medications for this visit.     Allergies: Patient has no known allergies.  Past Medical History:  Diagnosis Date  . Dyslipidemia     History reviewed. No pertinent surgical history.  History reviewed. No pertinent family history.  Social History   Tobacco Use  . Smoking status: Unknown If Ever Smoked  . Smokeless tobacco: Never Used  Substance Use Topics  . Alcohol use: Not on file    Subjective:  Patient presents to transfer care from Dr. Loanne Drilling; typically has been seeing Dr. Loanne Drilling one time per year; Is worried that her legs have been swelling more in the past 3-4 months; denies any chest pain or shortness of breath; notes that symptoms are more noticeable in the afternoon;  Admits that she has fallen at least 3x in the past few months- "feels that there is always a reason." Admits she is clumsy; 1st fall- she tripped over a rug; Notes that over a year ago, she fell down in her yard and "doesn't know why." Does have arthritis in her left knee- is suspicious that her left knee could have given out on her.  Planning to go see an orthopedist soon; uses OTC Tylenol with some relief; Denies any chest pain, shortness of breath, blurred vision or headache. Also mentions that she has been having increased problems with rectal bleeding lately; last colonoscopy in 2008- was due for repeat in 2018; does have history of IBS; has noticed some rectal  leakage recently as well;     Objective:  Vitals:   05/10/18 1119  BP: 116/68  Pulse: 60  Temp: 97.9 F (36.6 C)  TempSrc: Oral  SpO2: 98%  Weight: 161 lb 0.6 oz (73 kg)  Height: '5\' 1"'$  (1.549 m)    General: Well developed, well nourished, in no acute distress  Skin : Warm and dry.  Head: Normocephalic and atraumatic  Eyes: Sclera and conjunctiva clear; pupils round and reactive to light; extraocular movements intact  Ears: External normal; canals clear; tympanic membranes normal  Oropharynx: Pink, supple. No suspicious lesions  Neck: Supple without thyromegaly, adenopathy  Lungs: Respirations unlabored; clear to auscultation bilaterally without wheeze, rales, rhonchi  CVS exam: normal rate and regular rhythm.  Extremities: mild bilateral pedal edema- not pitting, cyanosis, clubbing  Vessels: Symmetric bilaterally  Neurologic: Alert and oriented; speech intact; face symmetrical; moves all extremities well; CNII-XII intact without focal deficit   Assessment:  1. Pedal edema   2. Pain of left lower extremity   3. Incontinence of feces, unspecified fecal incontinence type   4. Rectal bleeding   5. Weakness   6. Numbness   7. Other symptoms and signs involving the nervous system   8. Frequent falls     Plan:  1. Check CBC, CMP, BNP and CXR today; update EKG- bradycardia noted; to consider cardiology referral; 2. Encouraged to see her orthopedist about her chronic left knee pain; she notes she does not need a  referral. 3. & 4. Refer to GI; she notes she may not schedule this appointment as her symptoms only occur every few months; 5-8. Am concerned for neurologic source; during course of visit, patient does appear to have difficulty finding words- this was documented by her last PCP last year as well; would like to update MRI; may need to refer to neurology; she again notes she will probably not do the MRI as she does not want to risk not being able to take care of her husband.    Spent 45+ minutes with patient today;   No follow-ups on file.  Orders Placed This Encounter  Procedures  . DG Chest 2 View    Standing Status:   Future    Number of Occurrences:   1    Standing Expiration Date:   07/10/2019    Order Specific Question:   Reason for Exam (SYMPTOM  OR DIAGNOSIS REQUIRED)    Answer:   pedal edema    Order Specific Question:   Preferred imaging location?    Answer:   [facility]    Order Specific Question:   Radiology Contrast Protocol - do NOT remove file path    Answer:   \\[email or web address]  . MR Brain W Wo Contrast    Standing Status:   Future    Standing Expiration Date:   07/10/2019    Order Specific Question:   ** REASON FOR EXAM (FREE TEXT)    Answer:   Recurrent falls    Order Specific Question:   If indicated for the ordered procedure, I authorize the administration of contrast media per Radiology protocol    Answer:   Yes    Order Specific Question:   What is the patient's sedation requirement?    Answer:   No Sedation    Order Specific Question:   Does the patient have a pacemaker or implanted devices?    Answer:   No    Order Specific Question:   Radiology Contrast Protocol - do NOT remove file path    Answer:   \\[email or web address]    Order Specific Question:   Preferred imaging location?    Answer:   [facility] (table limit-550lbs)  . CBC w/Diff    Standing Status:   Future    Number of Occurrences:   1    Standing Expiration Date:   05/10/2019  . Comp Met (CMET)    Standing Status:   Future    Number of Occurrences:   1    Standing Expiration Date:   05/10/2019  . B Nat Peptide    Standing Status:   Future    Number of Occurrences:   1    Standing Expiration Date:   05/10/2019  . TSH    Standing Status:   Future    Number of Occurrences:   1    Standing Expiration Date:   05/10/2019  . B12    Standing Status:   Future    Number of Occurrences:   1     Standing Expiration Date:   05/10/2019  . Ambulatory referral to Gastroenterology    Referral Priority:   Routine    Referral Type:   Consultation    Referral Reason:   Specialty Services Required    Number of Visits Requested:   1  . EKG 12-Lead    Requested Prescriptions    No prescriptions requested or ordered in this encounter

## 2018-05-11 ENCOUNTER — Other Ambulatory Visit: Payer: Self-pay | Admitting: Family

## 2018-05-11 DIAGNOSIS — R7989 Other specified abnormal findings of blood chemistry: Secondary | ICD-10-CM

## 2018-05-11 DIAGNOSIS — R600 Localized edema: Secondary | ICD-10-CM

## 2018-05-11 MED ORDER — FUROSEMIDE 20 MG PO TABS: 20.0000 mg | ORAL_TABLET | Freq: Every day | ORAL | 0 refills | Status: DC | PRN

## 2018-05-11 NOTE — Progress Notes (Signed)
Hiatal hernia noted on CXR; no acute heart/ lung issues seen.

## 2018-06-02 ENCOUNTER — Other Ambulatory Visit: Payer: Self-pay | Admitting: Family

## 2018-06-11 ENCOUNTER — Ambulatory Visit: Payer: Medicare Other | Admitting: Cardiology

## 2018-06-24 ENCOUNTER — Other Ambulatory Visit: Payer: Self-pay | Admitting: Family

## 2018-07-17 ENCOUNTER — Other Ambulatory Visit: Payer: Self-pay | Admitting: Family

## 2018-07-27 ENCOUNTER — Encounter: Payer: Self-pay | Admitting: Family

## 2018-08-17 ENCOUNTER — Other Ambulatory Visit: Payer: Self-pay | Admitting: Family

## 2018-09-07 DIAGNOSIS — Z1231 Encounter for screening mammogram for malignant neoplasm of breast: Secondary | ICD-10-CM | POA: Diagnosis not present

## 2018-09-07 DIAGNOSIS — Z01419 Encounter for gynecological examination (general) (routine) without abnormal findings: Secondary | ICD-10-CM | POA: Diagnosis not present

## 2018-09-07 DIAGNOSIS — Z6829 Body mass index (BMI) 29.0-29.9, adult: Secondary | ICD-10-CM | POA: Diagnosis not present

## 2018-09-07 LAB — HM MAMMOGRAPHY

## 2018-09-08 ENCOUNTER — Other Ambulatory Visit: Payer: Self-pay | Admitting: Obstetrics & Gynecology

## 2018-09-08 DIAGNOSIS — R928 Other abnormal and inconclusive findings on diagnostic imaging of breast: Secondary | ICD-10-CM

## 2018-09-09 ENCOUNTER — Ambulatory Visit
Admission: RE | Admit: 2018-09-09 | Discharge: 2018-09-09 | Disposition: A | Discharge status: Home / Self Care | Payer: Medicare Other | Source: Ambulatory Visit | Attending: Obstetrics & Gynecology | Admitting: Obstetrics & Gynecology

## 2018-09-09 ENCOUNTER — Other Ambulatory Visit: Payer: Self-pay

## 2018-09-09 DIAGNOSIS — R928 Other abnormal and inconclusive findings on diagnostic imaging of breast: Secondary | ICD-10-CM

## 2018-09-09 DIAGNOSIS — N6002 Solitary cyst of left breast: Secondary | ICD-10-CM | POA: Diagnosis not present

## 2018-09-20 ENCOUNTER — Other Ambulatory Visit: Payer: Self-pay | Admitting: Family

## 2018-09-20 MED ORDER — FUROSEMIDE 20 MG PO TABS: 20.0000 mg | ORAL_TABLET | Freq: Every day | ORAL | 0 refills | Status: DC | PRN

## 2018-09-21 ENCOUNTER — Encounter: Payer: Self-pay | Admitting: Family

## 2018-09-21 NOTE — Progress Notes (Signed)
Outside notes received. Information abstracted. Notes sent to scan.  

## 2018-10-05 ENCOUNTER — Other Ambulatory Visit (INDEPENDENT_AMBULATORY_CARE_PROVIDER_SITE_OTHER): Payer: Medicare Other

## 2018-10-05 ENCOUNTER — Other Ambulatory Visit: Payer: Self-pay

## 2018-10-05 ENCOUNTER — Ambulatory Visit (INDEPENDENT_AMBULATORY_CARE_PROVIDER_SITE_OTHER): Payer: Medicare Other | Admitting: Physician Assistant

## 2018-10-05 ENCOUNTER — Encounter: Payer: Self-pay | Admitting: Physician Assistant

## 2018-10-05 VITALS — BP 120/64 | HR 80 | Temp 97.3°F | Ht 61.0 in | Wt 160.8 lb

## 2018-10-05 DIAGNOSIS — K625 Hemorrhage of anus and rectum: Secondary | ICD-10-CM

## 2018-10-05 DIAGNOSIS — R159 Full incontinence of feces: Secondary | ICD-10-CM

## 2018-10-05 LAB — CBC WITH DIFFERENTIAL/PLATELET
Basophils Absolute: 0.1 10*3/uL (ref 0.0–0.1)
Basophils Relative: 1.2 % (ref 0.0–3.0)
Eosinophils Absolute: 0.3 10*3/uL (ref 0.0–0.7)
Eosinophils Relative: 4.9 % (ref 0.0–5.0)
HCT: 36.9 % (ref 36.0–46.0)
Hemoglobin: 12.4 g/dL (ref 12.0–15.0)
Lymphocytes Relative: 24 % (ref 12.0–46.0)
Lymphs Abs: 1.7 10*3/uL (ref 0.7–4.0)
MCHC: 33.5 g/dL (ref 30.0–36.0)
MCV: 91.2 fl (ref 78.0–100.0)
Monocytes Absolute: 0.5 10*3/uL (ref 0.1–1.0)
Monocytes Relative: 7.3 % (ref 3.0–12.0)
Neutro Abs: 4.3 10*3/uL (ref 1.4–7.7)
Neutrophils Relative %: 62.6 % (ref 43.0–77.0)
Platelets: 302 10*3/uL (ref 150.0–400.0)
RBC: 4.05 Mil/uL (ref 3.87–5.11)
RDW: 13.9 % (ref 11.5–15.5)
WBC: 6.9 10*3/uL (ref 4.0–10.5)

## 2018-10-05 NOTE — Progress Notes (Signed)
Subjective:    Patient ID: Brittney Miller, female    DOB: 08-21-1940, 78 y.o.   MRN: [id number]  HPI Brittney Miller is a pleasant 78 year old white female, known remotely to Dr. Delfin Edis from colonoscopy done in 2008.  She is referred today by Wille Glaser, FNP for evaluation of fecal incontinence and intermittent rectal bleeding. Patient has prior history of IBS, osteoarthritis, depression, and hyperlipidemia. Colonoscopy was done in 2008 for screening.  There were no polyps, she had mild sigmoid diverticulosis and normal-appearing rectum. Patient says her current symptoms started probably a little over a year ago, with some progression since onset.  She describes frequent episodes of fecal incontinence not necessarily occurring every day.  She rarely has nocturnal episodes and most of this occurs during the daytime.  She says she will have no warning and will pass a small bowel movement, and not know until it happens.  She is also having frequent seepage from her rectum, staining her underclothes.  She is now wearing depends on a regular basis.  She does have days that she is able to get to the bathroom for a bowel movement and says with these episodes she generally will pass a larger amount of stool.  She denies any difficulty with constipation or excessive straining.  No complaints of her abdominal pain or cramping.  She will occasionally see small amounts of bright red blood primarily on the tissue.  She feels as if something protrudes from her rectum at times.  She has manually pushed this tissue back in but says most of the time it will reduce on its own.  There is no associated pain or discomfort when this happens. No recent imaging, labs from March 2020 reviewed and unremarkable.  Review of Systems Pertinent positive and negative review of systems were noted in the above HPI section.  All other review of systems was otherwise negative.  Outpatient Encounter Medications as of 10/05/2018   Medication Sig  . aspirin EC 81 MG tablet Take 81 mg by mouth daily.  Marland Kitchen FLUoxetine (PROZAC) 40 MG capsule TK ONE C PO QAM  . furosemide (LASIX) 20 MG tablet Take 1 tablet (20 mg total) by mouth daily as needed.  Marland Kitchen MAGNESIUM PO Take 1 tablet by mouth daily.  . Multiple Vitamin (MULTIVITAMIN) tablet Take 1 tablet by mouth daily.   No facility-administered encounter medications on file as of 10/05/2018.    No Known Allergies Patient Active Problem List   Diagnosis Date Noted  . Word finding difficulty 07/09/2017  . Wellness examination 10/09/2014  . Depression 10/09/2014  . Dyslipidemia 10/09/2014  . Osteoporosis 10/09/2014  . IBS (irritable bowel syndrome) 10/09/2014  . Osteoarthritis 10/09/2014  . THIRST 10/03/2008   Social History   Socioeconomic History  . Marital status: Married    Spouse name: Not on file  . Number of children: Not on file  . Years of education: Not on file  . Highest education level: Not on file  Occupational History  . Not on file  Social Needs  . Financial resource strain: Not on file  . Food insecurity    Worry: Not on file    Inability: Not on file  . Transportation needs    Medical: Not on file    Non-medical: Not on file  Tobacco Use  . Smoking status: Never Smoker  . Smokeless tobacco: Never Used  Substance and Sexual Activity  . Alcohol use: Yes    Alcohol/week: 0.0 standard drinks  Comment: rare  . Drug use: Not on file  . Sexual activity: Not on file  Lifestyle  . Physical activity    Days per week: Not on file    Minutes per session: Not on file  . Stress: Not on file  Relationships  . Social Herbalist on phone: Not on file    Gets together: Not on file    Attends religious service: Not on file    Active member of club or organization: Not on file    Attends meetings of clubs or organizations: Not on file    Relationship status: Not on file  . Intimate partner violence    Fear of current or ex partner: Not on  file    Emotionally abused: Not on file    Physically abused: Not on file    Forced sexual activity: Not on file  Other Topics Concern  . Not on file  Social History Narrative  . Not on file    Brittney Miller's family history is not on file.      Objective:    Vitals:   10/05/18 1027  BP: 120/64  Pulse: 80  Temp: (!) 97.3 F (36.3 C)    Physical Exam Well-developed well-nourished elderly WF female in no acute distress.  Height, Weight, BMI 30.38  HEENT; nontraumatic normocephalic, EOMI, PE R LA, sclera anicteric. Oropharynx;benign Neck; supple, no JVD Cardiovascular; regular rate and rhythm with S1-S2, no murmur rub or gallop Pulmonary; Clear bilaterally Abdomen; soft, nontender, nondistended, no palpable mass or hepatosplenomegaly, bowel sounds are active Rectal; markedly decreased anal sphincter tone, absent wink, no significant external hemorrhoids, no palpable lesion, unable to demonstrate rectal prolapse with Valsalva Skin; benign exam, no jaundice rash or appreciable lesions Extremities; no clubbing cyanosis or edema skin warm and dry Neuro/Psych; alert and oriented x4, grossly nonfocal mood and affect appropriate, anxious and some difficulty with word finding       Assessment & Plan:   #36 78 year old white female with at least 1 year history of somewhat progressive fecal incontinence.  She is having very frequent episodes, though not daily.  She is also experiencing seepage of fluid from the anus. On exam she has almost absent anal sphincter tone, suspect she has intermittent partial rectal prolapse, though unable to demonstrate today.  #2 intermittent small-volume hematochezia-probably secondary to internal hemorrhoids and/or rectal prolapse, cannot rule out occult colon lesion  #3 colon cancer screening-last colonoscopy 2008, no polyps overdue for follow-up #4 diverticulosis #5 prior history of IBS 6.  History of depression 7.  Hyperlipidemia  Plan; start  Benefiber 1 scoop daily in a glass of water every day.  Patient apparently has been taking several vitamins and supplements, she believes she is taking magnesium and asked her to stop this.  I discussed findings on rectal exam, and initial recommendations for bulking of stool.  we discussed colonoscopy which should be done for screening purposes and for further evaluation of current symptoms.  She is uncertain about transportation and whether or not her children could be of assistance.  She is not wanting to schedule today.  She also mentioned that her husband passed away about a month ago and that she is not been functioning well over the past couple of weeks and would like to wait.  We will schedule her in office follow-up with Dr. Loletha Carrow or myself in 1 month, hopefully can get her scheduled for colonoscopy at that time.  CBC today  Irais Mottram S  Annalynne Ibanez PA-C 10/05/2018   Cc: Marrian Salvage,*

## 2018-10-05 NOTE — Progress Notes (Signed)
____________________________________________________________  Attending physician addendum:  Thank you for sending this case to me. I have reviewed the entire note, and the outlined plan seems appropriate.  When she is ready to do so, needs a colonoscopy to assess for HR or evidence of prolapse, less likely proctitis.  Wilfrid Lund, MD  ____________________________________________________________

## 2018-10-05 NOTE — Patient Instructions (Signed)
Your provider has requested that you go to the basement level for lab work before leaving today. Press "B" on the elevator. The lab is located at the first door on the left as you exit the elevator.  Stop magnesium  Start benefiber daily in one glass of water  See appointment time for follow up appointment with dr Loletha Carrow

## 2018-10-12 ENCOUNTER — Other Ambulatory Visit: Payer: Self-pay | Admitting: Family

## 2018-10-13 DIAGNOSIS — F331 Major depressive disorder, recurrent, moderate: Secondary | ICD-10-CM | POA: Diagnosis not present

## 2018-10-25 ENCOUNTER — Encounter: Payer: Self-pay | Admitting: Cardiology

## 2018-10-25 ENCOUNTER — Other Ambulatory Visit: Payer: Self-pay

## 2018-10-25 ENCOUNTER — Ambulatory Visit (INDEPENDENT_AMBULATORY_CARE_PROVIDER_SITE_OTHER): Payer: Medicare Other | Admitting: Cardiology

## 2018-10-25 VITALS — BP 114/64 | HR 55 | Temp 97.2°F | Ht 61.0 in | Wt 159.0 lb

## 2018-10-25 DIAGNOSIS — R4789 Other speech disturbances: Secondary | ICD-10-CM | POA: Diagnosis not present

## 2018-10-25 DIAGNOSIS — M7989 Other specified soft tissue disorders: Secondary | ICD-10-CM | POA: Diagnosis not present

## 2018-10-25 NOTE — Patient Instructions (Addendum)
Medication Instructions:  NO CHANGES    Lab work: NOT NEEDED   Testing/Procedures: WILL BE SCHEDULE AT [facility] Your physician has requested that you have an echocardiogram. Echocardiography is a painless test that uses sound waves to create images of your heart. It provides your doctor with information about the size and shape of your heart and how well your heart's chambers and valves are working. This procedure takes approximately one hour. There are no restrictions for this procedure.   Follow-Up: At [facility], you and your health needs are our priority.  As part of our continuing mission to provide you with exceptional heart care, we have created designated Provider Care Teams.  These Care Teams include your primary Cardiologist (physician) and Advanced Practice Providers (APPs -  Physician Assistants and Nurse Practitioners) who all work together to provide you with the care you need, when you need it. You will need a follow up appointment ON AN AS NEEDED BASIS - IF  TEST IS NORMAL       RECOMMEND YOU PURCHASE SOME KNEE HI  SUPPORT STOCKINGS OR SOCKS - 10 -15 [employer] WEAR DURING THE DAY AND TAKE OFF AT BEDTIME/

## 2018-10-25 NOTE — Progress Notes (Signed)
PCP: Marrian Salvage, FNP  Clinic Note:  Chief Complaint  Patient presents with  . New Admit To SNF    She cannot tell me; by report elevated BNP and edema  . Edema    HPI: Brittney Miller is a 78 y.o. female who is being seen today for the evaluation of leg swelling and elevated BNP at the request of Marrian Salvage,*.  Brittney Miller was seen on May 10, 2018 by PCP (during her transition of care visit) -she noticed swelling for the last 3 to 4 months in her legs.  No shortness of breath.  Edema more notable in the afternoon.  Noted that she had 3 falls in the preceding few months.  Usually that she is clumsy.  Trips etc.  No syncope or near syncope.  Significant left knee arthritis. -BNP level checked--was 182, therefore referred for cardiology visit  Recent Hospitalizations: None  Studies Personally Reviewed - (if available, images/films reviewed: From Epic Chart or Care Everywhere)  None  Interval History: I am seeing Brittney Miller here today.  She is a very difficult historian.  She seems very timid and speaking, and almost barely speaks.  She tends to stutter little bit and does not have very good recollection.  I had to ask leading questions for her to find the knowledge that she has been having swelling in her legs worse at the end of the day -> she says that the swelling goes up to the knees.  It is been ongoing for about the last 2 or 3 years.  She takes as needed furosemide every day to keep the fluid at day.  She elevates her legs but when she shows me how she does it it is on a footstool if in front of her with her legs straight out and locked. The swelling is not associated with PND orthopnea.  She does not have any exertional dyspnea or chest pain/pressure. She does not complain of heavy dullness or aching in the legs. She may get a little short of breath when she is doing gardening, but is pretty much not very active.  Very difficult to obtain  the remainder of the cardiovascular review of symptoms, but essentially as follows: No palpitations, lightheadedness, dizziness, weakness or syncope/near syncope. No TIA/amaurosis fugax symptoms. No claudication.  ROS: A comprehensive was performed. Review of Systems  Constitutional: Negative for malaise/fatigue.  HENT: Positive for hearing loss. Negative for congestion and nosebleeds.   Respiratory: Negative for cough and shortness of breath.   Cardiovascular: Positive for leg swelling.       Otherwise negative per HPI  Gastrointestinal: Negative for blood in stool, heartburn, melena, nausea and vomiting.  Genitourinary: Negative for hematuria.  Musculoskeletal: Positive for falls (Intermittently.  None recently) and joint pain (Knee pain/arthritis).  Neurological: Positive for dizziness.       Poor balance  Psychiatric/Behavioral: Positive for memory loss. The patient is nervous/anxious.        Very difficult historian.  All other systems reviewed and are negative.  The patient does not have symptoms concerning for COVID-19 infection (fever, chills, cough, or new shortness of breath).  The patient is practicing social distancing.   COVID-19 Education: The signs and symptoms of COVID-19 were discussed with the patient and how to seek care for testing (follow up with PCP or arrange E-visit).   The importance of social distancing was discussed today.   I have reviewed and (if needed) personally updated the patient's problem  list, medications, allergies, past medical and surgical history, social and family history.   Past Medical History:  Diagnosis Date  . Anxiety   . Arthritis   . Dyslipidemia     Past Surgical History:  Procedure Laterality Date  . Cataract surgery    . CHOLECYSTECTOMY    . TONSILLECTOMY      Current Meds  Medication Sig  . aspirin EC 81 MG tablet Take 81 mg by mouth daily.  Marland Kitchen FLUoxetine (PROZAC) 40 MG capsule TK ONE C PO QAM  . furosemide (LASIX)  20 MG tablet TAKE 1 TABLET BY MOUTH EVERY DAY AS NEEDED  . MAGNESIUM PO Take 1 tablet by mouth daily.  . Multiple Vitamin (MULTIVITAMIN) tablet Take 1 tablet by mouth daily.    No Known Allergies  Social History   Tobacco Use  . Smoking status: Never Smoker  . Smokeless tobacco: Never Used  Substance Use Topics  . Alcohol use: Yes    Alcohol/week: 0.0 standard drinks    Comment: rare  . Drug use: Never   Social History   Social History Narrative   She lives with her daughter.   Her husband of many years recently passed away (as of Oct 01, 2018)   In addition to her 3 children, she has 5 grandchildren and 1 great grandchild.      Former distant history of smoking a pack a day for 5 years.  Quit years ago.   Occasional caffeine.  Occasional alcohol.   Exercise limited by osteoarthritis.  Tries to walk some.      She is currently retired.  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    family history includes Brain cancer in her maternal uncle; Breast cancer in her maternal grandmother; Cancer in her daughter; Diabetes in her father; Hypertension in her mother.  Wt Readings from Last 3 Encounters:  10/25/18 159 lb (72.1 kg)  10/05/18 160 lb 12.8 oz (72.9 kg)  05/10/18 161 lb 0.6 oz (73 kg)    PHYSICAL EXAM BP 114/64 (BP Location: Left Arm, Patient Position: Sitting, Cuff Size: Normal)   Pulse (!) 55   Temp (!) 97.2 F (36.2 C)   Ht 5\' 1"  (1.549 m)   Wt 159 lb (72.1 kg)   LMP  (LMP Unknown)   BMI 30.04 kg/m  Physical Exam  Constitutional: She is oriented to person, place, and time. She appears well-developed and well-nourished.  Well-groomed elderly woman.  In no acute distress, very timid and tentative.  Seems hard of hearing and very poor historian.  Poor memory.  HENT:  Head: Normocephalic and atraumatic.  Mouth/Throat: Oropharynx is clear and moist.  Hard of hearing  Eyes: Pupils are equal,  round, and reactive to light. Conjunctivae and EOM are normal. No scleral icterus.  Neck: Normal range of motion. Neck supple. No hepatojugular reflux and no JVD present. Carotid bruit is not present. No thyromegaly present.  Cardiovascular: Regular rhythm, S1 normal and S2 normal.  Occasional extrasystoles are present. Bradycardia present. PMI is not displaced (Difficult to palpate). Exam reveals distant heart sounds and decreased pulses (Mildly decreased pedal pulses, palpable). Exam reveals no gallop and no friction rub.  No murmur (Cannot exclude soft basal SEM) heard. Pulmonary/Chest: Effort normal and breath sounds normal. No respiratory distress. She has no wheezes. She has no rales.  He did have some mild basal crackles that cleared with cough  Abdominal: Soft. Bowel sounds are normal. She exhibits no distension. There is no abdominal tenderness. There is no rebound.  Musculoskeletal: Normal range of motion.        General: Edema (Trace to 1+ bilateral lower extremity edema) present.  Neurological: She is alert and oriented to person, place, and time. No cranial nerve deficit.  Skin: Skin is warm and dry. No erythema.  Psychiatric:  Somewhat timid flat affect.  Reluctant to answer questions.  Slow to answer questions.  Vitals reviewed.    Adult ECG Report  Rate: 55 ;  Rhythm: sinus bradycardia, sinus arrhythmia and Left axis deviation (-34).  Borderline LVH with some repolarization changes.  Otherwise nonspecific ST-T wave changes.;   Narrative Interpretation: Relatively normal EKG   Other studies Reviewed: Additional studies/ records that were reviewed today include:  Recent Labs:   Lab Results  Component Value Date   CHOL 201 (H) 07/09/2017   HDL 69.30 07/09/2017   LDLCALC 113 (H) 07/09/2017   TRIG 92.0 07/09/2017   CHOLHDL 3 07/09/2017  BNP - 182  Lab Results  Component Value Date   CREATININE 0.64 05/10/2018   BUN 14 05/10/2018   NA 138 05/10/2018   K 4.2 05/10/2018    CL 104 05/10/2018   CO2 27 05/10/2018    ASSESSMENT / PLAN: Problem List Items Addressed This Visit    Word finding difficulty    She does have difficulty with word finding and it makes of her difficult to obtain a history from her.  Unfortunately this seems to be an ongoing thing for her.      Swelling of lower leg - Primary (Chronic)    He really has isolated lower extremity edema which is more than likely related to venous stasis.  She has no other symptoms of heart failure such as PND orthopnea.  No real exertional dyspnea.  She is already on PRN Lasix. A BNP of 182 is not overly worrisome.  Just to exclude any potential right-sided failure or true left-sided failure, will check 2D echocardiogram. Otherwise continue PRN Lasix. As I think this may be venous stasis, will recommend support stockings to wear during the daylight hours.      Relevant Orders   EKG 12-Lead (Completed)   ECHOCARDIOGRAM COMPLETE       I spent a total of 25 minutes with the patient and chart review. >  50% of the time was spent in direct patient consultation. Additional 10 to 50 minutes spent with charting reviewing prior records and intake information.  Updating chart.  Current medicines are reviewed at length with the patient today.  (+/- concerns) none The following changes have been made:  None for now  Patient Instructions  Medication Instructions:  NO CHANGES    Lab work: NOT NEEDED   Testing/Procedures: WILL BE SCHEDULE AT [place] [facility] Your physician has requested that you have an echocardiogram. Echocardiography is a painless test that uses sound waves to create images of your heart. It provides your doctor with information about the size and shape of your heart and how well your heart's chambers and valves are working. This procedure takes approximately one hour. There are no restrictions for this procedure.   Follow-Up: At [facility], you and your health  needs are our priority.  As part of our continuing mission to provide you with exceptional heart care, we have created designated Provider Care Teams.  These Care Teams include your primary Cardiologist (physician) and Advanced  Practice Providers (APPs -  Physician Assistants and Nurse Practitioners) who all work together to provide you with the care you need, when you need it. You will need a follow up appointment ON AN AS NEEDED BASIS - IF  TEST IS NORMAL       RECOMMEND YOU PURCHASE SOME KNEE HI  SUPPORT STOCKINGS OR SOCKS - 10 -15 MMHG WEAR DURING THE DAY AND TAKE OFF AT BEDTIME/    Studies Ordered:   Orders Placed This Encounter  Procedures  . EKG 12-Lead  . ECHOCARDIOGRAM COMPLETE      Glenetta Hew, M.D., M.S. Interventional Cardiologist   Pager # [phone] Phone # [phone] [address] [place], [place] [address]   Thank you for choosing Heartcare at [facility]!!

## 2018-10-26 ENCOUNTER — Encounter: Payer: Self-pay | Admitting: Cardiology

## 2018-10-26 NOTE — Assessment & Plan Note (Signed)
He really has isolated lower extremity edema which is more than likely related to venous stasis.  She has no other symptoms of heart failure such as PND orthopnea.  No real exertional dyspnea.  She is already on PRN Lasix. A BNP of 182 is not overly worrisome.  Just to exclude any potential right-sided failure or true left-sided failure, will check 2D echocardiogram. Otherwise continue PRN Lasix. As I think this may be venous stasis, will recommend support stockings to wear during the daylight hours.

## 2018-10-26 NOTE — Assessment & Plan Note (Signed)
She does have difficulty with word finding and it makes of her difficult to obtain a history from her.  Unfortunately this seems to be an ongoing thing for her.

## 2018-10-27 ENCOUNTER — Other Ambulatory Visit: Payer: Self-pay | Admitting: Family

## 2018-10-27 MED ORDER — FUROSEMIDE 20 MG PO TABS: 20.0000 mg | ORAL_TABLET | Freq: Every day | ORAL | 1 refills | Status: DC | PRN

## 2018-11-02 ENCOUNTER — Ambulatory Visit (HOSPITAL_COMMUNITY): Payer: Medicare Other | Attending: Cardiovascular Disease

## 2018-11-02 ENCOUNTER — Other Ambulatory Visit: Payer: Self-pay

## 2018-11-02 DIAGNOSIS — I77810 Thoracic aortic ectasia: Secondary | ICD-10-CM | POA: Diagnosis not present

## 2018-11-02 DIAGNOSIS — I071 Rheumatic tricuspid insufficiency: Secondary | ICD-10-CM | POA: Insufficient documentation

## 2018-11-02 DIAGNOSIS — E785 Hyperlipidemia, unspecified: Secondary | ICD-10-CM | POA: Diagnosis not present

## 2018-11-02 DIAGNOSIS — M7989 Other specified soft tissue disorders: Secondary | ICD-10-CM | POA: Insufficient documentation

## 2018-11-02 DIAGNOSIS — R2243 Localized swelling, mass and lump, lower limb, bilateral: Secondary | ICD-10-CM

## 2018-11-09 ENCOUNTER — Encounter: Payer: Self-pay | Admitting: Gastroenterology

## 2018-11-09 ENCOUNTER — Ambulatory Visit (INDEPENDENT_AMBULATORY_CARE_PROVIDER_SITE_OTHER): Payer: Medicare Other | Admitting: Gastroenterology

## 2018-11-09 ENCOUNTER — Other Ambulatory Visit: Payer: Self-pay

## 2018-11-09 VITALS — BP 112/60 | HR 72 | Temp 98.2°F | Ht 58.75 in | Wt 159.1 lb

## 2018-11-09 DIAGNOSIS — R159 Full incontinence of feces: Secondary | ICD-10-CM | POA: Diagnosis not present

## 2018-11-09 DIAGNOSIS — K625 Hemorrhage of anus and rectum: Secondary | ICD-10-CM

## 2018-11-09 DIAGNOSIS — K623 Rectal prolapse: Secondary | ICD-10-CM | POA: Diagnosis not present

## 2018-11-09 DIAGNOSIS — Z23 Encounter for immunization: Secondary | ICD-10-CM | POA: Diagnosis not present

## 2018-11-09 MED ORDER — NA SULFATE-K SULFATE-MG SULF 17.5-3.13-1.6 GM/177ML PO SOLN: 1.0000 | Freq: Once | ORAL | 0 refills | Status: AC

## 2018-11-09 NOTE — Progress Notes (Signed)
     [facility] Progress Note  Chief Complaint: Fecal incontinence  Subjective  History: Brittney Miller was recently seen by our PA, Amy Esterwood, after previously being seen by Dr. Olevia Perches in 2008.  Brittney Miller has had fecal incontinence with intermittent bleeding, and on exam was found to have poor resting and voluntary sphincter tone with clinical concerns for possible rectal prolapse.  The patient did not feel she was ready and able to schedule a colonoscopy at that time.  Brittney Miller is somewhat improved since last visit, which she attributes to fiber supplement and discontinuing magnesium.  She has fewer episodes of incontinence, but it is still bothersome.  She still has some blood on the paper.  Denies abdominal pain, loss of appetite or weight loss.  ROS: Cardiovascular:  no chest pain Respiratory: no dyspnea  The patient's Past Medical, Family and Social History were reviewed and are on file in the EMR.  Objective:  Med list reviewed  Current Outpatient Medications:  .  aspirin EC 81 MG tablet, Take 81 mg by mouth daily., Disp: , Rfl:  .  FLUoxetine (PROZAC) 40 MG capsule, TK ONE C PO QAM, Disp: , Rfl:  .  furosemide (LASIX) 20 MG tablet, Take 1 tablet (20 mg total) by mouth daily as needed., Disp: 90 tablet, Rfl: 1 .  MAGNESIUM PO, Take 1 tablet by mouth 2 (two) times a week. , Disp: , Rfl:  .  Multiple Vitamin (MULTIVITAMIN) tablet, Take 1 tablet by mouth daily., Disp: , Rfl:  .  Na Sulfate-K Sulfate-Mg Sulf 17.5-3.13-1.6 GM/177ML SOLN, Take 1 kit by mouth once for 1 dose., Disp: 354 mL, Rfl: 0   Vital signs in last 24 hrs: Vitals:   11/09/18 1329  BP: 112/60  Pulse: 72  Temp: 98.2 F (36.8 C)    Physical Exam    HEENT: sclera anicteric, oral mucosa moist without lesions  Neck: supple, no thyromegaly, JVD or lymphadenopathy  Cardiac: RRR without murmurs, S1S2 heard, no peripheral edema  Pulm: clear to auscultation bilaterally, normal RR and effort noted  Abdomen:  soft, no tenderness, with active bowel sounds. No guarding or palpable hepatosplenomegaly.  Rectal: Very poor resting and voluntary sphincter tone.  Spontaneously reducing rectal prolapse was seen with Valsalva  Recent Labs:  Recent echo - normal LVEF   _0 @ Assessment: Encounter Diagnoses  Name Primary?  . Rectal bleeding Yes  . Full incontinence of feces   . Rectal prolapse    Colonoscopy recommended to evaluate for any other sources of bleeding.  She was agreeable after discussion of procedure and risks, and said she had a friend who could bring her.  I recommended a colorectal surgery evaluation to follow, but she is not yet certain she wants to proceed with that and will give it further consideration.  Total time 15 minutes, over half spent face-to-face with patient in counseling and coordination of care.   Brittney Miller

## 2018-11-09 NOTE — Patient Instructions (Signed)
If you are age 78 or older, your body mass index should be between 23-30. Your Body mass index is 32.41 kg/m. If this is out of the aforementioned range listed, please consider follow up with your Primary Care Provider.  If you are age 30 or younger, your body mass index should be between 19-25. Your Body mass index is 32.41 kg/m. If this is out of the aformentioned range listed, please consider follow up with your Primary Care Provider.   You have been scheduled for a colonoscopy. Please follow written instructions given to you at your visit today.  Please pick up your prep supplies at the pharmacy within the next 1-3 days. If you use inhalers (even only as needed), please bring them with you on the day of your procedure. Your physician has requested that you go to www.startemmi.com and enter the access code given to you at your visit today. This web site gives a general overview about your procedure. However, you should still follow specific instructions given to you by our office regarding your preparation for the procedure.  It was a pleasure to see you today!  Dr. Loletha Carrow

## 2018-12-14 ENCOUNTER — Telehealth: Payer: Self-pay

## 2018-12-14 NOTE — Telephone Encounter (Signed)
Covid-19 screening questions   Do you now or have you had a fever in the last 14 days? NO   Do you have any respiratory symptoms of shortness of breath or cough now or in the last 14 days? NO  Do you have any family members or close contacts with diagnosed or suspected Covid-19 in the past 14 days? NO  Have you been tested for Covid-19 and found to be positive? NO        

## 2018-12-15 ENCOUNTER — Ambulatory Visit (AMBULATORY_SURGERY_CENTER): Payer: Medicare Other | Admitting: Gastroenterology

## 2018-12-15 ENCOUNTER — Other Ambulatory Visit: Payer: Self-pay

## 2018-12-15 ENCOUNTER — Encounter: Payer: Self-pay | Admitting: Gastroenterology

## 2018-12-15 VITALS — BP 123/70 | HR 66 | Temp 97.9°F | Resp 16 | Ht <= 58 in | Wt 159.0 lb

## 2018-12-15 DIAGNOSIS — K625 Hemorrhage of anus and rectum: Secondary | ICD-10-CM

## 2018-12-15 DIAGNOSIS — D124 Benign neoplasm of descending colon: Secondary | ICD-10-CM

## 2018-12-15 DIAGNOSIS — D179 Benign lipomatous neoplasm, unspecified: Secondary | ICD-10-CM | POA: Diagnosis not present

## 2018-12-15 DIAGNOSIS — K635 Polyp of colon: Secondary | ICD-10-CM | POA: Diagnosis not present

## 2018-12-15 DIAGNOSIS — K5730 Diverticulosis of large intestine without perforation or abscess without bleeding: Secondary | ICD-10-CM | POA: Diagnosis not present

## 2018-12-15 MED ORDER — SODIUM CHLORIDE 0.9 % IV SOLN: 500.0000 mL | Freq: Once | INTRAVENOUS | Status: DC

## 2018-12-15 NOTE — Progress Notes (Signed)
PT taken to PACU. Monitors in place. VSS. Report given to RN. 

## 2018-12-15 NOTE — Patient Instructions (Signed)
YOU HAD AN ENDOSCOPIC PROCEDURE TODAY AT THE [facility]:   Refer to the procedure report that was given to you for any specific questions about what was found during the examination.  If the procedure report does not answer your questions, please call your gastroenterologist to clarify.  If you requested that your care partner not be given the details of your procedure findings, then the procedure report has been included in a sealed envelope for you to review at your convenience later.  YOU SHOULD EXPECT: Some feelings of bloating in the abdomen. Passage of more gas than usual.  Walking can help get rid of the air that was put into your GI tract during the procedure and reduce the bloating. If you had a lower endoscopy (such as a colonoscopy or flexible sigmoidoscopy) you may notice spotting of blood in your stool or on the toilet paper. If you underwent a bowel prep for your procedure, you may not have a normal bowel movement for a few days.  Please Note:  You might notice some irritation and congestion in your nose or some drainage.  This is from the oxygen used during your procedure.  There is no need for concern and it should clear up in a day or so.  SYMPTOMS TO REPORT IMMEDIATELY:   Following lower endoscopy (colonoscopy or flexible sigmoidoscopy):  Excessive amounts of blood in the stool  Significant tenderness or worsening of abdominal pains  Swelling of the abdomen that is new, acute  Fever of 100F or higher   For urgent or emergent issues, a gastroenterologist can be reached at any hour by calling [phone].   DIET:  We do recommend a small meal at first, but then you may proceed to your regular diet.  Drink plenty of fluids but you should avoid alcoholic beverages for 24 hours.  MEDICATIONS: Continue present medications.  Please see handouts given to you by your recovery nurse.  ACTIVITY:  You should plan to take it easy for the rest of today and you should  NOT DRIVE or use heavy machinery until tomorrow (because of the sedation medicines used during the test).    FOLLOW UP: Our staff will call the number listed on your records 48-72 hours following your procedure to check on you and address any questions or concerns that you may have regarding the information given to you following your procedure. If we do not reach you, we will leave a message.  We will attempt to reach you two times.  During this call, we will ask if you have developed any symptoms of COVID 19. If you develop any symptoms (ie: fever, flu-like symptoms, shortness of breath, cough etc.) before then, please call [phone].  If you test positive for Covid 19 in the 2 weeks post procedure, please call and report this information to us.    If any biopsies were taken you will be contacted by phone or by letter within the next 1-3 weeks.  Please call us at [phone] if you have not heard about the biopsies in 3 weeks.   Thank you for allowing us to provide for your healthcare needs today.   SIGNATURES/CONFIDENTIALITY: You and/or your care partner have signed paperwork which will be entered into your electronic medical record.  These signatures attest to the fact that that the information above on your After Visit Summary has been reviewed and is understood.  Full responsibility of the confidentiality of this discharge information lies with you and/or   your care-partner. 

## 2018-12-15 NOTE — Op Note (Signed)
[facility] Patient Name: Francile Pekala Procedure Date: 12/15/2018 11:35 AM MRN: [id number] Endoscopist: Mallie Mussel L. Loletha Carrow , MD Age: 78 Referring MD:  Date of Birth: 1940/08/16 Gender: Female Account #: [id number] Procedure:                Colonoscopy Indications:              Rectal bleeding (also fecal incontinence and rectal                            prolapse recently seen on rectal exam) Medicines:                Monitored Anesthesia Care Procedure:                Pre-Anesthesia Assessment:                           - Prior to the procedure, a History and Physical                            was performed, and patient medications and                            allergies were reviewed. The patient's tolerance of                            previous anesthesia was also reviewed. The risks                            and benefits of the procedure and the sedation                            options and risks were discussed with the patient.                            All questions were answered, and informed consent                            was obtained. Prior Anticoagulants: The patient has                            taken no previous anticoagulant or antiplatelet                            agents except for aspirin. ASA Grade Assessment: II                            - A patient with mild systemic disease. After                            reviewing the risks and benefits, the patient was                            deemed in satisfactory condition to undergo the  procedure.                           After obtaining informed consent, the colonoscope                            was passed under direct vision. Throughout the                            procedure, the patient's blood pressure, pulse, and                            oxygen saturations were monitored continuously. The                            Colonoscope was introduced through the anus and                             advanced to the the cecum, identified by                            appendiceal orifice and ileocecal valve. The                            colonoscopy was performed without difficulty. The                            patient tolerated the procedure well. The quality                            of the bowel preparation was good. The ileocecal                            valve, appendiceal orifice, and rectum were                            photographed. Scope In: 11:44:28 AM Scope Out: 11:58:03 AM Scope Withdrawal Time: 0 hours 8 minutes 29 seconds  Total Procedure Duration: 0 hours 13 minutes 35 seconds  Findings:                 The digital rectal exam findings include decreased                            sphincter tone.                           There was a lipoma in the ascending colon.                           A 5 mm polyp was found in the descending colon. The                            polyp was sessile. The polyp was removed with a  cold snare. Resection and retrieval were complete.                           Many diverticula were found in the left colon.                           An area of erythematous, edematous and granular                            mucosa was found in the distal rectum, consistent                            with prolapse effect. Impression:               - Decreased sphincter tone found on digital rectal                            exam.                           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                           - One 5 mm polyp in the descending colon, removed                            with a cold snare. Resected and retrieved.                           - Diverticulosis in the left colon.                           - Erythematous mucosa in the distal rectum from                            rectal prolapse. Recommendation:           - Patient has a contact number available for                             emergencies. The signs and symptoms of potential                            delayed complications were discussed with the                            patient. Return to normal activities tomorrow.                            Written discharge instructions were provided to the                            patient.                           - Resume previous diet.                           -  Continue present medications.                           - Await pathology results.                           - Based on current guidelines, no surveillance                            colonoscopy due to age.                           - Patient is considering colo-rectal surgery                            evaluation. Jamani Eley L. Loletha Carrow, MD 12/15/2018 12:06:07 PM This report has been signed electronically.

## 2018-12-15 NOTE — Progress Notes (Signed)
Called to room to assist during endoscopic procedure.  Patient ID and intended procedure confirmed with present staff. Received instructions for my participation in the procedure from the performing physician.  

## 2018-12-15 NOTE — Progress Notes (Signed)
Temperature taken by K.A., VS taken by C.W. 

## 2018-12-17 ENCOUNTER — Telehealth: Payer: Self-pay

## 2018-12-17 ENCOUNTER — Encounter: Payer: Self-pay | Admitting: Gastroenterology

## 2018-12-17 NOTE — Telephone Encounter (Signed)
  Follow up Call-  Call back number 12/15/2018  Post procedure Call Back phone  # [phone]  Permission to leave phone message Yes  Some recent data might be hidden     Patient questions:  Do you have a fever, pain , or abdominal swelling? No. Pain Score  0 *  Have you tolerated food without any problems? Yes.    Have you been able to return to your normal activities? Yes.    Do you have any questions about your discharge instructions: Diet   No. Medications  No. Follow up visit  No.  Do you have questions or concerns about your Care? No.  Actions: * If pain score is 4 or above: No action needed, pain <4.  1. Have you developed a fever since your procedure? No  2.   Have you had an respiratory symptoms (SOB or cough) since your procedure? No  3.   Have you tested positive for COVID 19 since your procedure No  4.   Have you had any family members/close contacts diagnosed with the COVID 19 since your procedure? No   If yes to any of these questions please route to Joylene John, RN and Alphonsa Gin, RN.

## 2019-01-25 ENCOUNTER — Telehealth: Payer: Self-pay | Admitting: Gastroenterology

## 2019-01-25 NOTE — Telephone Encounter (Signed)
Spoke to the patient and told the patient that a referral for an evaluation by a colo-rectal surgeon was faxed today to [facility].

## 2019-01-25 NOTE — Telephone Encounter (Signed)
Pt stated that Dr. Loletha Carrow recommended pt to see a surgeon, I believe for hemorrhoids (pt was not sure). Pt would like a call back.

## 2019-02-28 ENCOUNTER — Ambulatory Visit: Payer: Self-pay | Admitting: General Surgery

## 2019-02-28 ENCOUNTER — Telehealth: Payer: Self-pay | Admitting: *Deleted

## 2019-02-28 NOTE — Telephone Encounter (Signed)
   Primary Cardiologist:Dr.Harding  Chart reviewed as part of pre-operative protocol coverage. Given past medical history and time since last visit, based on ACC/AHA guidelines, Brittney Miller would be at acceptable risk for the planned procedure without further cardiovascular testing. She was last seen by Dr.Harding in August of 2020 with LEE felt to be related to venous insufficiency. No cardiac testing was planned.   I will route this recommendation to the requesting party via Epic fax function and remove from pre-op pool.  Please call with questions.  Phill Myron. West Pugh, ANP, AACC  02/28/2019, 4:42 PM

## 2019-02-28 NOTE — H&P (Signed)
History of Present Illness Leighton Ruff MD; [id number] 9:59 AM) The patient is a 79 year old female who presents with rectal prolapse. 79 year old female who presents to the office with rectal prolapse. She recently underwent a colonoscopy for rectal bleeding and this showed no signs of malignancy, but rectal inflammation consistent with prolapse. Patient has a history of loose stools and diarrhea. No major h/o constipation that she can remember. She denies any major constipation issues. Surgical history is significant for cholecystectomy and probable appendectomy. She also believes she has had a hysterectomy in the past. She is having fecal incontinence   Past Surgical History Mammie Lorenzo, LPN; 624THL 579FGE AM) Cataract Surgery Bilateral. Foot Surgery Bilateral. Gallbladder Surgery - Open  Allergies Geni Bers Haggett, RMA; 02/28/2019 9:34 AM) No Known Drug Allergies [02/28/2019]: Allergies Reconciled  Medication History [employer], RMA; 02/28/2019 9:35 AM) FLUoxetine HCl (40MG  Capsule, Oral) Active. Furosemide (20MG  Tablet, Oral) Active. Aspirin (81MG  Tablet DR, Oral) Active. Fish Oil (Oral) Specific strength unknown - Active. Multi Vitamin (Oral) Active. Vitamin B12 (Oral) Specific strength unknown - Active. Medications Reconciled  Social History Mammie Lorenzo, LPN; 624THL 579FGE AM) No alcohol use No caffeine use No drug use Tobacco use Never smoker.  Pregnancy / Birth History Mammie Lorenzo, LPN; 624THL 579FGE AM) Age at menarche 1 years.  Other Problems Mammie Lorenzo, LPN; 624THL 579FGE AM) Depression     Review of Systems Claiborne Billings Dockery LPN; 624THL 579FGE AM) General Not Present- Appetite Loss, Chills, Fatigue, Fever, Night Sweats, Weight Gain and Weight Loss. Skin Not Present- Change in Wart/Mole, Dryness, Hives, Jaundice, New Lesions, Non-Healing Wounds, Rash and Ulcer. HEENT Present- Ringing in the Ears and Wears  glasses/contact lenses. Not Present- Earache, Hearing Loss, Hoarseness, Nose Bleed, Oral Ulcers, Seasonal Allergies, Sinus Pain, Sore Throat, Visual Disturbances and Yellow Eyes. Respiratory Not Present- Bloody sputum, Chronic Cough, Difficulty Breathing, Snoring and Wheezing. Breast Not Present- Breast Mass, Breast Pain, Nipple Discharge and Skin Changes. Cardiovascular Present- Leg Cramps. Not Present- Chest Pain, Difficulty Breathing Lying Down, Palpitations, Rapid Heart Rate, Shortness of Breath and Swelling of Extremities. Gastrointestinal Not Present- Abdominal Pain, Bloating, Bloody Stool, Change in Bowel Habits, Chronic diarrhea, Constipation, Difficulty Swallowing, Excessive gas, Gets full quickly at meals, Hemorrhoids, Indigestion, Nausea, Rectal Pain and Vomiting. Female Genitourinary Not Present- Frequency, Nocturia, Painful Urination, Pelvic Pain and Urgency. Musculoskeletal Not Present- Back Pain, Joint Pain, Joint Stiffness, Muscle Pain, Muscle Weakness and Swelling of Extremities. Neurological Not Present- Decreased Memory, Fainting, Headaches, Numbness, Seizures, Tingling, Tremor, Trouble walking and Weakness. Psychiatric Present- Depression. Not Present- Anxiety, Bipolar, Change in Sleep Pattern, Fearful and Frequent crying. Endocrine Not Present- Cold Intolerance, Excessive Hunger, Hair Changes, Heat Intolerance, Hot flashes and New Diabetes. Hematology Not Present- Blood Thinners, Easy Bruising, Excessive bleeding, Gland problems, HIV and Persistent Infections.  Vitals Geni Bers Haggett RMA; 02/28/2019 9:35 AM) 02/28/2019 9:35 AM Weight: 156.4 lb Height: 59.5in Body Surface Area: 1.67 m Body Mass Index: 31.06 kg/m  Temp.: 97.27F(Temporal)  Pulse: 98 (Regular)  P.OX: 97% (Room air) BP: 104/68 (Sitting, Left Arm, Standard)        Physical Exam Leighton Ruff MD; [id number] 11:05 AM)  General Mental Status-Alert. General  Appearance-Cooperative.  Abdomen Palpation/Percussion Palpation and Percussion of the abdomen reveal - Soft and Non Tender.  Rectal Anorectal Exam External - Note: skin excoriation circumferentially. Internal - decreased sphincter tone(good squeeze pressure).    Assessment & Plan Leighton Ruff MD; [id number] 11:12 AM)  RECTAL PROLAPSE (K62.3) Impression: 79 year old female who presents to the office  with rectal prolapse and fecal incontinence. Her colonoscopy was neg for malignancy. This is clearly demonstrated on exam today. It sounds like she has more loose stools than constipation. I think she would benefit from a robotic-assisted rectopexy without resection. Given her history of incontinence and loose stools. Hopefully, this will cause her to have better control from her significantly diminished resting tone, although I cannot guarantee she will have complete control of her bowels. On exam, she does have good squeeze pressure, and I don't think she has any definitive needs for a colostomy.  This patient encounter took 45 minutes today to perform the following: Take history, perform exam, review outside records, interpret imaging, counseled the patient on her diagnosis and document encounter, findings and plan in the EHR.  We will have her obtain cardiac clearance. After thatm we schedule her surgery as soon as available. The surgery and anatomy were described to the patient as well as the risks of surgery and the possible complications. These include: Bleeding, deep abdominal infections and possible wound complications such as hernia and infection, damage to adjacent structures, leak of surgical connections, which can lead to other surgeries and possibly an ostomy, possible need for other procedures, such as abscess drains in radiology, possible prolonged hospital stay, possible diarrhea from removal of part of the colon, possible constipation from narcotics, possible bowel, bladder or  sexual dysfunction if having rectal surgery, prolonged fatigue/weakness or appetite loss, possible early recurrence of disease, possible complications of their medical problems such as heart disease or arrhythmias or lung problems, death (less than 1%). I believe the patient understands and wishes to proceed with the

## 2019-02-28 NOTE — Telephone Encounter (Signed)
   [facility] Pre-operative Risk Assessment    Request for surgical clearance:  1. What type of surgery is being performed? ROBOTIC ASSISTED RECTOPEXY  2. When is this surgery scheduled? TBD  3. What type of clearance is required (medical clearance vs. Pharmacy clearance to hold med vs. Both)? MEDICAL  4. Are there any medications that need to be held prior to surgery and how long? NONE   5. Practice name and name of physician performing surgery? [facility]   6. What is your office phone number [phone]   7.   What is your office fax number [phone]  8.   Anesthesia type (None, local, MAC, general) ? GENERAL   Devra Dopp 02/28/2019, 4:29 PM  _________________________________________________________________   (provider comments below)

## 2019-03-07 ENCOUNTER — Telehealth: Payer: Self-pay | Admitting: *Deleted

## 2019-03-07 NOTE — Telephone Encounter (Signed)
Followed up with [facility], patient was scheduled on 02/28/2019 at 9:10 am with Dr. Marcello Moores.

## 2019-03-16 DIAGNOSIS — F331 Major depressive disorder, recurrent, moderate: Secondary | ICD-10-CM | POA: Diagnosis not present

## 2019-03-31 ENCOUNTER — Encounter (HOSPITAL_COMMUNITY): Payer: Self-pay | Admitting: Emergency Medicine

## 2019-03-31 ENCOUNTER — Other Ambulatory Visit: Payer: Self-pay

## 2019-03-31 DIAGNOSIS — Z03818 Encounter for observation for suspected exposure to other biological agents ruled out: Secondary | ICD-10-CM | POA: Diagnosis not present

## 2019-03-31 DIAGNOSIS — Z20822 Contact with and (suspected) exposure to covid-19: Secondary | ICD-10-CM | POA: Diagnosis not present

## 2019-03-31 DIAGNOSIS — R1030 Lower abdominal pain, unspecified: Secondary | ICD-10-CM | POA: Insufficient documentation

## 2019-03-31 DIAGNOSIS — R339 Retention of urine, unspecified: Secondary | ICD-10-CM | POA: Insufficient documentation

## 2019-03-31 DIAGNOSIS — Z7982 Long term (current) use of aspirin: Secondary | ICD-10-CM | POA: Insufficient documentation

## 2019-03-31 DIAGNOSIS — Z79899 Other long term (current) drug therapy: Secondary | ICD-10-CM | POA: Diagnosis not present

## 2019-03-31 DIAGNOSIS — K625 Hemorrhage of anus and rectum: Secondary | ICD-10-CM | POA: Diagnosis present

## 2019-03-31 DIAGNOSIS — K5790 Diverticulosis of intestine, part unspecified, without perforation or abscess without bleeding: Secondary | ICD-10-CM | POA: Diagnosis not present

## 2019-03-31 DIAGNOSIS — K623 Rectal prolapse: Secondary | ICD-10-CM | POA: Insufficient documentation

## 2019-03-31 NOTE — ED Provider Notes (Signed)
[facility] DEPT Provider Note: Georgena Spurling, MD, FACEP  CSN: [id number] MRN: [id number] ARRIVAL: 03/31/19 at [place]: WA07/WA07   CHIEF COMPLAINT  Rectal Bleeding  Level 5 caveat: Expressive aphasia HISTORY OF PRESENT ILLNESS  03/31/19 11:53 PM Brittney Miller is a 79 y.o. female who complains of passage of blood and rectal pain.  This has been going off and on for an unspecified length of time.  She also sometimes has lower abdominal pain.  She cannot quantify this.  She states "my insides are coming out of my body" and "my insides are coming out of my skin".  The patient has what appears to be an expressive aphasia and has significant difficulty finding words and answering questions.  Her daughter, who accompanies her, is unable to add any history.   Past Medical History:  Diagnosis Date  . Anxiety   . Arthritis   . Cataract   . Dyslipidemia     Past Surgical History:  Procedure Laterality Date  . Cataract surgery    . CHOLECYSTECTOMY    . COLONOSCOPY    . TONSILLECTOMY      Family History  Problem Relation Age of Onset  . Hypertension Mother   . Diabetes Father   . Breast cancer Maternal Grandmother   . Cancer Daughter        With brain metastasis  . Brain cancer Maternal Uncle        Unsure if brain mets or primary neurologic trauma  . Colon cancer Neg Hx   . Esophageal cancer Neg Hx   . Pancreatic cancer Neg Hx   . Stomach cancer Neg Hx     Social History   Tobacco Use  . Smoking status: Never Smoker  . Smokeless tobacco: Never Used  Substance Use Topics  . Alcohol use: Yes    Alcohol/week: 0.0 standard drinks    Comment: rare  . Drug use: Never    Prior to Admission medications   Medication Sig Start Date End Date Taking? Authorizing Provider  acetaminophen (TYLENOL) 500 MG tablet Take 500 mg by mouth every 6 (six) hours as needed for mild pain.   Yes [provider]  aspirin EC 81 MG tablet Take 81 mg by mouth daily.   Yes  [provider]  FLUoxetine (PROZAC) 40 MG capsule Take 40 mg by mouth daily.  03/25/18  Yes [provider]  furosemide (LASIX) 20 MG tablet Take 1 tablet (20 mg total) by mouth daily as needed. Patient taking differently: Take 20 mg by mouth daily as needed for fluid.  10/27/18  Yes Marrian Salvage, FNP  MAGNESIUM PO Take 1 tablet by mouth 2 (two) times a week.    Yes [provider]  Multiple Vitamin (MULTIVITAMIN) tablet Take 1 tablet by mouth daily.   Yes [provider]    Allergies Patient has no known allergies.   REVIEW OF SYSTEMS     PHYSICAL EXAMINATION  Initial Vital Signs Blood pressure (!) 131/102, pulse 87, temperature 98.6 F (37 C), temperature source Oral, resp. rate 16, SpO2 98 %.  Examination General: Well-developed, well-nourished female in no acute distress; appearance consistent with age of record HENT: normocephalic; atraumatic Eyes: pupils equal, round and reactive to light; extraocular muscles intact Neck: supple Heart: regular rate and rhythm Lungs: clear to auscultation bilaterally Abdomen: soft; nondistended; nontender; bowel sounds present Rectal: Rectal prolapse, reducible but reprolapses when released, no necrosis seen:    Extremities: No deformity; full range  of motion; pulses normal Neurologic: Awake, alert; expressive aphasia; motor function intact in all extremities and symmetric; no facial droop Skin: Warm and dry Psychiatric: Normal mood and affect   RESULTS  Summary of this visit's results, reviewed and interpreted by myself:   EKG Interpretation  Date/Time:    Ventricular Rate:    PR Interval:    QRS Duration:   QT Interval:    QTC Calculation:   R Axis:     Text Interpretation:        Laboratory Studies: Results for orders placed or performed during the hospital encounter of 04/01/19 (from the past 24 hour(s))  Comprehensive metabolic panel     Status: Abnormal   Collection Time:  04/01/19 12:39 AM  Result Value Ref Range   Sodium 137 135 - 145 mmol/L   Potassium 3.4 (L) 3.5 - 5.1 mmol/L   Chloride 100 98 - 111 mmol/L   CO2 26 22 - 32 mmol/L   Glucose, Bld 112 (H) 70 - 99 mg/dL   BUN 14 8 - 23 mg/dL   Creatinine, Ser 0.58 0.44 - 1.00 mg/dL   Calcium 8.8 (L) 8.9 - 10.3 mg/dL   Total Protein 6.9 6.5 - 8.1 g/dL   Albumin 3.8 3.5 - 5.0 g/dL   AST 21 15 - 41 U/L   ALT 15 0 - 44 U/L   Alkaline Phosphatase 71 38 - 126 U/L   Total Bilirubin 1.0 0.3 - 1.2 mg/dL   GFR calc non Af Amer >60 >60 mL/min   GFR calc Af Amer >60 >60 mL/min   Anion gap 11 5 - 15  CBC     Status: None   Collection Time: 04/01/19 12:39 AM  Result Value Ref Range   WBC 9.0 4.0 - 10.5 K/uL   RBC 4.13 3.87 - 5.11 MIL/uL   Hemoglobin 12.3 12.0 - 15.0 g/dL   HCT 37.8 36.0 - 46.0 %   MCV 91.5 80.0 - 100.0 fL   MCH 29.8 26.0 - 34.0 pg   MCHC 32.5 30.0 - 36.0 g/dL   RDW 13.2 11.5 - 15.5 %   Platelets 331 150 - 400 K/uL   nRBC 0.0 0.0 - 0.2 %  Type and screen [place]     Status: None   Collection Time: 04/01/19 12:42 AM  Result Value Ref Range   ABO/RH(D) A POS    Antibody Screen NEG    Sample Expiration      04/04/2019,2359 Performed at [facility], [address] [place], [place] [address]   ABO/Rh     Status: None (Preliminary result)   Collection Time: 04/01/19 12:42 AM  Result Value Ref Range   ABO/RH(D)      A POS Performed at [facility], [place] [address] [place], [place] [address]   Urinalysis, Routine w reflex microscopic     Status: Abnormal   Collection Time: 04/01/19  5:09 AM  Result Value Ref Range   Color, Urine YELLOW YELLOW   APPearance CLEAR CLEAR   Specific Gravity, Urine 1.043 (H) 1.005 - 1.030   pH 7.0 5.0 - 8.0   Glucose, UA NEGATIVE NEGATIVE mg/dL   Hgb urine dipstick NEGATIVE NEGATIVE   Bilirubin Urine NEGATIVE NEGATIVE   Ketones, ur NEGATIVE NEGATIVE mg/dL   Protein, ur NEGATIVE NEGATIVE mg/dL    Nitrite NEGATIVE NEGATIVE   Leukocytes,Ua NEGATIVE NEGATIVE   Imaging Studies: CT ABDOMEN PELVIS W CONTRAST  Result Date: 04/01/2019 CLINICAL DATA:  Rectal bleeding EXAM: CT ABDOMEN AND PELVIS WITH CONTRAST TECHNIQUE: Multidetector CT imaging of the abdomen and pelvis was performed using the standard protocol following bolus administration of intravenous contrast. CONTRAST:  152mL OMNIPAQUE IOHEXOL 300 MG/ML  SOLN COMPARISON:  None. FINDINGS: LOWER CHEST: Normal. HEPATOBILIARY: Normal hepatic contours. No intra- or extrahepatic biliary dilatation. Status post cholecystectomy. PANCREAS: Normal pancreas. No ductal dilatation or peripancreatic fluid collection. SPLEEN: Normal. ADRENALS/URINARY TRACT: The adrenal glands are normal. No hydronephrosis, nephroureterolithiasis or solid renal mass. The urinary bladder is normal for degree of distention STOMACH/BOWEL: Small hiatal hernia. Normal course and caliber of the duodenum. No small bowel dilatation or inflammation. Rectosigmoid diverticulosis without acute inflammation. The appendix is not visualized. There is a rectal prolapse. VASCULAR/LYMPHATIC: There is calcific atherosclerosis of the abdominal aorta. No abdominal or pelvic lymphadenopathy. REPRODUCTIVE: Status post hysterectomy. No adnexal mass. MUSCULOSKELETAL. Multilevel degenerative disc disease and facet arthrosis. No bony spinal canal stenosis. OTHER: None. IMPRESSION: 1. Rectal prolapse. 2. No acute abnormality of the abdomen or pelvis. 3. Rectosigmoid diverticulosis without acute inflammation. 4. Aortic Atherosclerosis (ICD10-I70.0). Electronically Signed   By: Ulyses Jarred M.D.   On: 04/01/2019 02:13    ED COURSE and MDM  Nursing notes, initial and subsequent vitals signs, including pulse oximetry, reviewed and interpreted by myself.  Vitals:   04/01/19 0300 04/01/19 0400 04/01/19 0500 04/01/19 0600  BP: 116/67 (!) 113/56 (!) 113/58 (!) 113/50  Pulse: 67 69 63 64  Resp: 17 18 19 18    Temp:      TempSrc:      SpO2: 98% 98% 97% 95%  Weight:      Height:       Medications  sodium chloride (PF) 0.9 % injection (  Contrast Given 04/01/19 0145)  iohexol (OMNIPAQUE) 300 MG/ML solution 100 mL (100 mLs Intravenous Contrast Given 04/01/19 0151)   6:12 AM Prolapsed rectum remains reduced.  Foley catheter left in place and we will send her home with a leg bag.  We will have her follow-up with Dr. Leighton Ruff of [facility] (her surgery is currently scheduled for the 19th of this month) and I have sent her a message via Epic regarding today's ED visit.  She was advised to return to the emergency department if her prolapse recurs and she is unable to reduce it.  PROCEDURES  Procedures  REDUCTION OF PROLAPSED RECTUM Distended bladder was shown on the patient CT scan.  There was concerned that this may be preventing reduction of her rectal prolapse.  An in and out catheterization was performed by nursing staff who obtained 900 mL of urine post void residual.  The prolapsed rectum, which had been kept moist with sterile saline gauze following initial examination, was irrigated with normal saline and manually reduced into the pelvis with gentle pressure.  The patient was then advised to tighten her anal sphincter and she was able to keep the rectum from reprolapsing.  The patient tolerated this well and there were no immediate complications.  A Foley catheter was then placed by nursing staff to keep the bladder empty.  ED DIAGNOSES     ICD-10-CM   1. Rectal prolapse  K62.3   2. Urinary retention  R33.9        Dawn Kiper, Jenny Reichmann, MD 04/01/19 0630

## 2019-04-01 ENCOUNTER — Encounter (HOSPITAL_COMMUNITY): Payer: Self-pay

## 2019-04-01 ENCOUNTER — Emergency Department (HOSPITAL_COMMUNITY): Payer: Medicare Other

## 2019-04-01 ENCOUNTER — Emergency Department (HOSPITAL_COMMUNITY)
Admission: EM | Admit: 2019-04-01 | Discharge: 2019-04-01 | Disposition: A | Discharge status: Home / Self Care | Payer: Medicare Other | Attending: Emergency Medicine | Admitting: Emergency Medicine

## 2019-04-01 DIAGNOSIS — K623 Rectal prolapse: Secondary | ICD-10-CM

## 2019-04-01 DIAGNOSIS — K5790 Diverticulosis of intestine, part unspecified, without perforation or abscess without bleeding: Secondary | ICD-10-CM | POA: Diagnosis not present

## 2019-04-01 DIAGNOSIS — R339 Retention of urine, unspecified: Secondary | ICD-10-CM

## 2019-04-01 LAB — TYPE AND SCREEN
ABO/RH(D): A POS
Antibody Screen: NEGATIVE

## 2019-04-01 LAB — URINALYSIS, ROUTINE W REFLEX MICROSCOPIC
Bilirubin Urine: NEGATIVE
Glucose, UA: NEGATIVE mg/dL
Hgb urine dipstick: NEGATIVE
Ketones, ur: NEGATIVE mg/dL
Leukocytes,Ua: NEGATIVE
Nitrite: NEGATIVE
Protein, ur: NEGATIVE mg/dL
Specific Gravity, Urine: 1.043 — ABNORMAL HIGH (ref 1.005–1.030)
pH: 7 (ref 5.0–8.0)

## 2019-04-01 LAB — COMPREHENSIVE METABOLIC PANEL
ALT: 15 U/L (ref 0–44)
AST: 21 U/L (ref 15–41)
Albumin: 3.8 g/dL (ref 3.5–5.0)
Alkaline Phosphatase: 71 U/L (ref 38–126)
Anion gap: 11 (ref 5–15)
BUN: 14 mg/dL (ref 8–23)
CO2: 26 mmol/L (ref 22–32)
Calcium: 8.8 mg/dL — ABNORMAL LOW (ref 8.9–10.3)
Chloride: 100 mmol/L (ref 98–111)
Creatinine, Ser: 0.58 mg/dL (ref 0.44–1.00)
GFR calc Af Amer: >60 mL/min (ref >60)
GFR calc non Af Amer: >60 mL/min (ref >60)
Glucose, Bld: 112 mg/dL — ABNORMAL HIGH (ref 70–99)
Potassium: 3.4 mmol/L — ABNORMAL LOW (ref 3.5–5.1)
Sodium: 137 mmol/L (ref 135–145)
Total Bilirubin: 1 mg/dL (ref 0.3–1.2)
Total Protein: 6.9 g/dL (ref 6.5–8.1)

## 2019-04-01 LAB — CBC
HCT: 37.8 % (ref 36.0–46.0)
Hemoglobin: 12.3 g/dL (ref 12.0–15.0)
MCH: 29.8 pg (ref 26.0–34.0)
MCHC: 32.5 g/dL (ref 30.0–36.0)
MCV: 91.5 fL (ref 80.0–100.0)
Platelets: 331 10*3/uL (ref 150–400)
RBC: 4.13 MIL/uL (ref 3.87–5.11)
RDW: 13.2 % (ref 11.5–15.5)
WBC: 9 10*3/uL (ref 4.0–10.5)
nRBC: 0 % (ref 0.0–0.2)

## 2019-04-01 LAB — SARS CORONAVIRUS 2 (TAT 6-24 HRS): SARS Coronavirus 2: NEGATIVE

## 2019-04-01 LAB — ABO/RH: ABO/RH(D): A POS

## 2019-04-01 MED ORDER — IOHEXOL 300 MG/ML  SOLN
100.0000 mL | Freq: Once | INTRAMUSCULAR | Status: AC | PRN
  Administered 2019-04-01: 100 mL via INTRAVENOUS

## 2019-04-01 MED ORDER — SODIUM CHLORIDE (PF) 0.9 % IJ SOLN
INTRAMUSCULAR | Status: AC
  Filled 2019-04-01: qty 50

## 2019-04-01 NOTE — ED Notes (Signed)
Pt ambulatory from triage with one assist.

## 2019-04-01 NOTE — ED Notes (Signed)
Foley changed to a leg bag and explained to the patient how to empty and put on.

## 2019-04-01 NOTE — ED Notes (Signed)
Attempted to call daughter Bertram Millard about possible discharge. No answer, left a message for her to call me back. Her number is [phone]

## 2019-04-01 NOTE — ED Triage Notes (Signed)
Patient states she started having rectal bleeding since 5 pm tonight. Patient states something is protruding out her rectum. Patient is not a good historian and called daughter to come inside to help with patient.

## 2019-04-01 NOTE — ED Notes (Signed)
Spoke with patient's daughter. Daughter is on her way to pick the patient up.

## 2019-04-11 ENCOUNTER — Ambulatory Visit: Payer: Medicare Other | Attending: Internal Medicine

## 2019-04-11 DIAGNOSIS — Z23 Encounter for immunization: Secondary | ICD-10-CM | POA: Insufficient documentation

## 2019-04-11 NOTE — Progress Notes (Signed)
   Covid-19 Vaccination Clinic  Name:  Zerline Spiva    MRN: [id number] DOB: 12-Jun-1940  04/11/2019  Ms. Spatafora was observed post Covid-19 immunization for 15 minutes without incidence. She was provided with Vaccine Information Sheet and instruction to access the V-Safe system.   Ms. Monto was instructed to call 911 with any severe reactions post vaccine: Marland Kitchen Difficulty breathing  . Swelling of your face and throat  . A fast heartbeat  . A bad rash all over your body  . Dizziness and weakness    Immunizations Administered    Name Date Dose VIS Date Route   Pfizer COVID-19 Vaccine 04/11/2019  4:10 PM 0.3 mL 02/04/2019 Intramuscular   Manufacturer: [place]   Lot: [id number]   [place]: [id number]

## 2019-04-11 NOTE — Patient Instructions (Addendum)
DUE TO COVID-19 ONLY ONE VISITOR IS ALLOWED TO COME WITH YOU AND STAY IN THE WAITING ROOM ONLY DURING PRE OP AND PROCEDURE DAY OF SURGERY. THE 1 VISITOR MAY VISIT WITH YOU AFTER SURGERY IN YOUR PRIVATE ROOM DURING VISITING HOURS ONLY!  YOU NEED TO HAVE A COVID 19 TEST ON: 04/12/19 @ 10:00 AM_, THIS TEST MUST BE DONE BEFORE SURGERY, COME  [place], [place] , [address].  ([place]) ONCE YOUR COVID TEST IS COMPLETED, PLEASE BEGIN THE QUARANTINE INSTRUCTIONS AS OUTLINED IN YOUR HANDOUT.                Allean Found     Your procedure is scheduled on: 04/15/19   Report to [facility] Main  Entrance   Report to SHORT STAY at: 5:30 AM     Call this number if you have problems the morning of surgery [phone]    Remember:   [place], NO CHEWING GUM [place].     Take these medicines the morning of surgery with A SIP OF WATER: FLUOXETINE               You may not have any metal on your body including hair pins and              piercings  Do not wear jewelry, make-up, lotions, powders or perfumes, deodorant             Do not wear nail polish on your fingernails.  Do not shave  48 hours prior to surgery.                 Do not bring valuables to the hospital. [place].  Contacts, dentures or bridgework may not be worn into surgery.  Leave suitcase in the car. After surgery it may be brought to your room.             Eat a light diet the day before surgery.  Examples including soups, broths, toast, yogurt, mashed potatoes.  Things to avoid include carbonated beverages (fizzy beverages), raw fruits and raw vegetables, or beans.   If your bowels are filled with gas, your surgeon will have difficulty visualizing your pelvic organs which increases your surgical risks. NO SOLID FOOD AFTER MIDNIGHT THE NIGHT PRIOR TO SURGERY. NOTHING BY  MOUTH EXCEPT CLEAR LIQUIDS UNTIL: 4:30 AM . PLEASE FINISH ENSURE DRINK PER SURGEON ORDER  WHICH NEEDS TO BE COMPLETED AT : 4:30 AM.   CLEAR LIQUID DIET   Foods Allowed                                                                     Foods Excluded  Coffee and tea, regular and decaf                             liquids that you cannot  Plain Jell-O any favor except red or purple  see through such as: Fruit ices (not with fruit pulp)                                     milk, soups, orange juice  Iced Popsicles                                    All solid food Carbonated beverages, regular and diet                                    Cranberry, grape and apple juices Sports drinks like Gatorade Lightly seasoned clear broth or consume(fat free) Sugar, honey syrup  Sample Menu Breakfast                                Lunch                                     Supper Cranberry juice                    Beef broth                            Chicken broth Jell-O                                     Grape juice                           Apple juice Coffee or tea                        Jell-O                                      Popsicle                                                Coffee or tea                        Coffee or tea  _____________________________________________________________________  [facility] - Preparing for Surgery Before surgery, you can play an important role.  Because skin is not sterile, your skin needs to be as free of germs as possible.  You can reduce the number of germs on your skin by washing with CHG (chlorahexidine gluconate) soap before surgery.  CHG is an antiseptic cleaner which kills germs and bonds with the skin to continue killing germs even after washing. Please DO NOT use if you have an allergy to CHG or antibacterial soaps.  If your skin becomes reddened/irritated stop using the CHG and inform your nurse when you  arrive at Short Stay. Do not shave (including legs and underarms)  for at least 48 hours prior to the first CHG shower.  You may shave your face/neck. Please follow these instructions carefully:  1.  Shower with CHG Soap the night before surgery and the  morning of Surgery.  2.  If you choose to wash your hair, wash your hair first as usual with your  normal  shampoo.  3.  After you shampoo, rinse your hair and body thoroughly to remove the  shampoo.                           4.  Use CHG as you would any other liquid soap.  You can apply chg directly  to the skin and wash                       Gently with a scrungie or clean washcloth.  5.  Apply the CHG Soap to your body ONLY FROM THE NECK DOWN.   Do not use on face/ open                           Wound or open sores. Avoid contact with eyes, ears mouth and genitals (private parts).                       Wash face,  Genitals (private parts) with your normal soap.             6.  Wash thoroughly, paying special attention to the area where your surgery  will be performed.  7.  Thoroughly rinse your body with warm water from the neck down.  8.  DO NOT shower/wash with your normal soap after using and rinsing off  the CHG Soap.                9.  Pat yourself dry with a clean towel.            10.  Wear clean pajamas.            11.  Place clean sheets on your bed the night of your first shower and do not  sleep with pets. Day of Surgery : Do not apply any lotions/deodorants the morning of surgery.  Please wear clean clothes to the hospital/surgery center.  FAILURE TO FOLLOW THESE INSTRUCTIONS MAY RESULT IN THE CANCELLATION OF YOUR SURGERY PATIENT SIGNATURE_________________________________  NURSE SIGNATURE__________________________________  ________________________________________________________________________   Adam Phenix  An incentive spirometer is a tool that can help keep your lungs clear and active. This tool measures how  well you are filling your lungs with each breath. Taking long deep breaths may help reverse or decrease the chance of developing breathing (pulmonary) problems (especially infection) following:  A long period of time when you are unable to move or be active. BEFORE THE PROCEDURE   If the spirometer includes an indicator to show your best effort, your nurse or respiratory therapist will set it to a desired goal.  If possible, sit up straight or lean slightly forward. Try not to slouch.  Hold the incentive spirometer in an upright position. INSTRUCTIONS FOR USE  1. Sit on the edge of your bed if possible, or sit up as far as you can in bed or on a chair. 2. Hold the incentive spirometer in an upright position. 3. Breathe out normally. 4. Place the mouthpiece in your mouth and seal your lips tightly  around it. 5. Breathe in slowly and as deeply as possible, raising the piston or the ball toward the top of the column. 6. Hold your breath for 3-5 seconds or for as long as possible. Allow the piston or ball to fall to the bottom of the column. 7. Remove the mouthpiece from your mouth and breathe out normally. 8. Rest for a few seconds and repeat Steps 1 through 7 at least 10 times every 1-2 hours when you are awake. Take your time and take a few normal breaths between deep breaths. 9. The spirometer may include an indicator to show your best effort. Use the indicator as a goal to work toward during each repetition. 10. After each set of 10 deep breaths, practice coughing to be sure your lungs are clear. If you have an incision (the cut made at the time of surgery), support your incision when coughing by placing a pillow or rolled up towels firmly against it. Once you are able to get out of bed, walk around indoors and cough well. You may stop using the incentive spirometer when instructed by your caregiver.  RISKS AND COMPLICATIONS  Take your time so you do not get dizzy or light-headed.  If you  are in pain, you may need to take or ask for pain medication before doing incentive spirometry. It is harder to take a deep breath if you are having pain. AFTER USE  Rest and breathe slowly and easily.  It can be helpful to keep track of a log of your progress. Your caregiver can provide you with a simple table to help with this. If you are using the spirometer at home, follow these instructions: [place] IF:   You are having difficultly using the spirometer.  You have trouble using the spirometer as often as instructed.  Your pain medication is not giving enough relief while using the spirometer.  You develop fever of 100.5 F (38.1 C) or higher. SEEK IMMEDIATE MEDICAL CARE IF:   You cough up bloody sputum that had not been present before.  You develop fever of 102 F (38.9 C) or greater.  You develop worsening pain at or near the incision site. MAKE SURE YOU:   Understand these instructions.  Will watch your condition.  Will get help right away if you are not doing well or get worse. Document Released: 06/23/2006 Document Revised: 05/05/2011 Document Reviewed: 08/24/2006 [facility] Patient Information 2014 [place], [place].   ________________________________________________________________________

## 2019-04-12 ENCOUNTER — Other Ambulatory Visit (HOSPITAL_COMMUNITY)
Admission: RE | Admit: 2019-04-12 | Discharge: 2019-04-12 | Disposition: A | Discharge status: Home / Self Care | Payer: Medicare Other | Source: Ambulatory Visit | Attending: General Surgery | Admitting: General Surgery

## 2019-04-12 ENCOUNTER — Encounter (HOSPITAL_COMMUNITY)
Admission: RE | Admit: 2019-04-12 | Discharge: 2019-04-12 | Disposition: A | Discharge status: Home / Self Care | Payer: Medicare Other | Source: Ambulatory Visit | Attending: General Surgery | Admitting: General Surgery

## 2019-04-12 ENCOUNTER — Other Ambulatory Visit: Payer: Self-pay

## 2019-04-12 ENCOUNTER — Encounter (HOSPITAL_COMMUNITY): Payer: Self-pay

## 2019-04-12 DIAGNOSIS — Z7982 Long term (current) use of aspirin: Secondary | ICD-10-CM | POA: Insufficient documentation

## 2019-04-12 DIAGNOSIS — F419 Anxiety disorder, unspecified: Secondary | ICD-10-CM | POA: Insufficient documentation

## 2019-04-12 DIAGNOSIS — Z20822 Contact with and (suspected) exposure to covid-19: Secondary | ICD-10-CM | POA: Insufficient documentation

## 2019-04-12 DIAGNOSIS — Z79899 Other long term (current) drug therapy: Secondary | ICD-10-CM | POA: Insufficient documentation

## 2019-04-12 DIAGNOSIS — Z01818 Encounter for other preprocedural examination: Secondary | ICD-10-CM | POA: Insufficient documentation

## 2019-04-12 DIAGNOSIS — M1990 Unspecified osteoarthritis, unspecified site: Secondary | ICD-10-CM | POA: Insufficient documentation

## 2019-04-12 DIAGNOSIS — K623 Rectal prolapse: Secondary | ICD-10-CM | POA: Insufficient documentation

## 2019-04-12 DIAGNOSIS — N189 Chronic kidney disease, unspecified: Secondary | ICD-10-CM | POA: Insufficient documentation

## 2019-04-12 HISTORY — DX: Chronic kidney disease, unspecified: N18.9

## 2019-04-12 LAB — CBC
HCT: 37.9 % (ref 36.0–46.0)
Hemoglobin: 12.2 g/dL (ref 12.0–15.0)
MCH: 29.7 pg (ref 26.0–34.0)
MCHC: 32.2 g/dL (ref 30.0–36.0)
MCV: 92.2 fL (ref 80.0–100.0)
Platelets: 352 10*3/uL (ref 150–400)
RBC: 4.11 MIL/uL (ref 3.87–5.11)
RDW: 13.5 % (ref 11.5–15.5)
WBC: 7.4 10*3/uL (ref 4.0–10.5)
nRBC: 0 % (ref 0.0–0.2)

## 2019-04-12 LAB — SARS CORONAVIRUS 2 (TAT 6-24 HRS): SARS Coronavirus 2: NEGATIVE

## 2019-04-12 NOTE — Progress Notes (Signed)
PCP - Dr. Valere Dross Cardiologist - Dr. Ellyn Hack.LOV: 8/20. Clearance: K. Lawrence: NP: 02/28/19. EPIC  Chest x-ray - 05/10/18 EKG - 10/25/18 Stress Test -  ECHO - 11/02/18 Cardiac Cath -   Sleep Study -  CPAP -   Fasting Blood Sugar -  Checks Blood Sugar _____ times a day  Blood Thinner Instructions: Aspirin Instructions:RN recomended to pt. And her daughter to call MD. For instructions about Aspirin. Last Dose:  Anesthesia review:   Patient denies shortness of breath, fever, cough and chest pain at PAT appointment   Patient verbalized understanding of instructions that were given to them at the PAT appointment. Patient was also instructed that they will need to review over the PAT instructions again at home before surgery.

## 2019-04-12 NOTE — Progress Notes (Signed)
Anesthesia Chart Review   Case: [id number] Date/Time: 04/15/19 0700   Procedure: XI ROBOT ASSISTED RECTOPEXY (N/A )   Anesthesia type: General   Pre-op diagnosis: RECTAL PROLAPSE   Location: [facility] ROOM 02 / [facility]   Surgeons: Leighton Ruff, MD      [id number] y.o. never smoker with h/o CKD, rectal prolapse scheduled for above procedure [id number] with Dr. Leighton Ruff.   Clearance received by cardiology.  Per Jory Sims, NP, "Chart reviewed as part of pre-operative protocol coverage. Given past medical history and time since last visit, based on ACC/AHA guidelines, Brittney Miller would be at acceptable risk for the planned procedure without further cardiovascular testing. She was last seen by Dr.Harding in August of 2020 with LEE felt to be related to venous insufficiency. No cardiac testing was planned."  Anticipate pt can proceed with planned procedure barring acute status change.   VS: BP 126/60 (BP Location: Right Arm)   Pulse 70   Temp 37 C (Oral)   Resp 18   Ht 5' (1.524 m)   Wt 67.8 kg   LMP  (LMP Unknown)   SpO2 99%   BMI 29.21 kg/m   PROVIDERS: Marrian Salvage, FNP is PCP   Glenetta Hew, MD is Cardiologist  LABS: Labs reviewed: Acceptable for surgery. (all labs ordered are listed, but only abnormal results are displayed)  Labs Reviewed  CBC     IMAGES:   EKG: 10/25/2018 Rate 55 bpm Sinus bradycardia with sinus arrhythmia  Left axis deviation  Moderate voltage criteria for LVH, may be normal variant   CV: Echo 11/02/2018 IMPRESSIONS   1. The left ventricle has normal systolic function with an ejection  fraction of 60-65%. The cavity size was normal. Left ventricular diastolic  Doppler parameters are consistent with pseudonormalization.  2. The right ventricle has normal systolic function. The cavity was  normal. There is no increase in right ventricular wall thickness.  3. Right atrial size was mildly dilated.  4. No stenosis  of the aortic valve.  5. The aorta is abnormal unless otherwise noted.  6. There is mild dilatation of the ascending aorta.  7. The atrial septum is grossly normal.  Past Medical History:  Diagnosis Date  . Anxiety   . Arthritis   . Cataract   . Chronic kidney disease   . Dyslipidemia     Past Surgical History:  Procedure Laterality Date  . Cataract surgery    . CHOLECYSTECTOMY    . COLONOSCOPY    . TONSILLECTOMY      MEDICATIONS: . acetaminophen (TYLENOL) 500 MG tablet  . aspirin EC 81 MG tablet  . FLUoxetine (PROZAC) 40 MG capsule  . furosemide (LASIX) 20 MG tablet  . MAGNESIUM PO  . Multiple Vitamin (MULTIVITAMIN) tablet   . 0.9 %  sodium chloride infusion   Maia Plan [facility] Pre-Surgical Testing [phone] 04/12/19  4:14 PM

## 2019-04-14 MED ORDER — BUPIVACAINE LIPOSOME 1.3 % IJ SUSP
20.0000 mL | Freq: Once | INTRAMUSCULAR | Status: DC
  Filled 2019-04-14: qty 20

## 2019-04-14 NOTE — Anesthesia Preprocedure Evaluation (Addendum)
Anesthesia Evaluation  Patient identified by MRN, date of birth, ID band Patient awake    Reviewed: Allergy & Precautions, NPO status , Patient's Chart, lab work & pertinent test results  Airway Mallampati: II  TM Distance: >3 FB Neck ROM: Full    Dental  (+) Teeth Intact, Dental Advisory Given   Pulmonary neg pulmonary ROS,    Pulmonary exam normal breath sounds clear to auscultation       Cardiovascular negative cardio ROS Normal cardiovascular exam Rhythm:Regular Rate:Normal  Echo 11/02/2018 IMPRESSIONS   1. The left ventricle has normal systolic function with an ejection  fraction of 60-65%. The cavity size was normal. Left ventricular diastolic  Doppler parameters are consistent with pseudonormalization.  2. The right ventricle has normal systolic function. The cavity was  normal. There is no increase in right ventricular wall thickness.  3. Right atrial size was mildly dilated.  4. No stenosis of the aortic valve.  5. The aorta is abnormal unless otherwise noted.  6. There is mild dilatation of the ascending aorta.  7. The atrial septum is grossly normal.    Neuro/Psych PSYCHIATRIC DISORDERS Anxiety Depression negative neurological ROS     GI/Hepatic Neg liver ROS, RECTAL PROLAPSE   Endo/Other  negative endocrine ROS  Renal/GU Renal InsufficiencyRenal disease     Musculoskeletal  (+) Arthritis ,   Abdominal   Peds  Hematology negative hematology ROS (+)   Anesthesia Other Findings Day of surgery medications reviewed with the patient.  Reproductive/Obstetrics                            Anesthesia Physical Anesthesia Plan  ASA: II  Anesthesia Plan: General   Post-op Pain Management:    Induction: Intravenous  PONV Risk Score and Plan: 4 or greater and Dexamethasone, Ondansetron, Propofol infusion and Treatment may vary due to age or medical condition  Airway  Management Planned: Oral ETT  Additional Equipment:   Intra-op Plan:   Post-operative Plan: Extubation in OR  Informed Consent: I have reviewed the patients History and Physical, chart, labs and discussed the procedure including the risks, benefits and alternatives for the proposed anesthesia with the patient or authorized representative who has indicated his/her understanding and acceptance.     Dental advisory given  Plan Discussed with: CRNA  Anesthesia Plan Comments: (2nd PIV)       Anesthesia Quick Evaluation

## 2019-04-15 ENCOUNTER — Inpatient Hospital Stay (HOSPITAL_COMMUNITY)
Admission: RE | Admit: 2019-04-15 | Discharge: 2019-04-18 | DRG: 331 | Disposition: A | Discharge status: Home / Self Care | Payer: Medicare Other | Attending: General Surgery | Admitting: General Surgery

## 2019-04-15 ENCOUNTER — Other Ambulatory Visit: Payer: Self-pay

## 2019-04-15 ENCOUNTER — Encounter (HOSPITAL_COMMUNITY): Payer: Self-pay | Admitting: General Surgery

## 2019-04-15 ENCOUNTER — Inpatient Hospital Stay (HOSPITAL_COMMUNITY): Payer: Medicare Other | Admitting: Anesthesiology

## 2019-04-15 ENCOUNTER — Encounter (HOSPITAL_COMMUNITY)
Admission: RE | Disposition: A | Discharge status: Home / Self Care | Payer: Self-pay | Source: Ambulatory Visit | Attending: General Surgery

## 2019-04-15 ENCOUNTER — Inpatient Hospital Stay (HOSPITAL_COMMUNITY): Payer: Medicare Other | Admitting: Physician Assistant

## 2019-04-15 DIAGNOSIS — Z960 Presence of urogenital implants: Secondary | ICD-10-CM | POA: Diagnosis present

## 2019-04-15 DIAGNOSIS — F329 Major depressive disorder, single episode, unspecified: Secondary | ICD-10-CM | POA: Diagnosis present

## 2019-04-15 DIAGNOSIS — M1990 Unspecified osteoarthritis, unspecified site: Secondary | ICD-10-CM | POA: Diagnosis not present

## 2019-04-15 DIAGNOSIS — I872 Venous insufficiency (chronic) (peripheral): Secondary | ICD-10-CM | POA: Diagnosis present

## 2019-04-15 DIAGNOSIS — F419 Anxiety disorder, unspecified: Secondary | ICD-10-CM | POA: Diagnosis present

## 2019-04-15 DIAGNOSIS — E785 Hyperlipidemia, unspecified: Secondary | ICD-10-CM | POA: Diagnosis not present

## 2019-04-15 DIAGNOSIS — Z9049 Acquired absence of other specified parts of digestive tract: Secondary | ICD-10-CM

## 2019-04-15 DIAGNOSIS — K623 Rectal prolapse: Principal | ICD-10-CM | POA: Diagnosis present

## 2019-04-15 DIAGNOSIS — F418 Other specified anxiety disorders: Secondary | ICD-10-CM | POA: Diagnosis not present

## 2019-04-15 DIAGNOSIS — Z9842 Cataract extraction status, left eye: Secondary | ICD-10-CM | POA: Diagnosis not present

## 2019-04-15 DIAGNOSIS — Z9841 Cataract extraction status, right eye: Secondary | ICD-10-CM | POA: Diagnosis not present

## 2019-04-15 DIAGNOSIS — Z7982 Long term (current) use of aspirin: Secondary | ICD-10-CM | POA: Diagnosis not present

## 2019-04-15 DIAGNOSIS — Z79899 Other long term (current) drug therapy: Secondary | ICD-10-CM | POA: Diagnosis not present

## 2019-04-15 DIAGNOSIS — M810 Age-related osteoporosis without current pathological fracture: Secondary | ICD-10-CM | POA: Diagnosis present

## 2019-04-15 DIAGNOSIS — Z973 Presence of spectacles and contact lenses: Secondary | ICD-10-CM | POA: Diagnosis not present

## 2019-04-15 DIAGNOSIS — Z20822 Contact with and (suspected) exposure to covid-19: Secondary | ICD-10-CM | POA: Diagnosis present

## 2019-04-15 HISTORY — PX: XI ROBOT ASSISTED RECTOPEXY: SHX6788

## 2019-04-15 SURGERY — RECTOPEXY, ROBOT-ASSISTED
Anesthesia: General

## 2019-04-15 MED ORDER — PROPOFOL 10 MG/ML IV BOLUS
INTRAVENOUS | Status: DC | PRN
  Administered 2019-04-15: 120 mg via INTRAVENOUS

## 2019-04-15 MED ORDER — LIDOCAINE 20MG/ML (2%) 15 ML SYRINGE OPTIME
INTRAMUSCULAR | Status: DC | PRN
  Administered 2019-04-15: 1.5 mg/kg/h via INTRAVENOUS

## 2019-04-15 MED ORDER — EPHEDRINE 5 MG/ML INJ
INTRAVENOUS | Status: AC
  Filled 2019-04-15: qty 10

## 2019-04-15 MED ORDER — PROPOFOL 10 MG/ML IV BOLUS
INTRAVENOUS | Status: AC
  Filled 2019-04-15: qty 20

## 2019-04-15 MED ORDER — GABAPENTIN 300 MG PO CAPS
300.0000 mg | ORAL_CAPSULE | ORAL | Status: AC
  Administered 2019-04-15: 300 mg via ORAL
  Filled 2019-04-15: qty 1

## 2019-04-15 MED ORDER — DIPHENHYDRAMINE HCL 12.5 MG/5ML PO ELIX: 12.5000 mg | ORAL_SOLUTION | Freq: Four times a day (QID) | ORAL | Status: DC | PRN

## 2019-04-15 MED ORDER — LACTATED RINGERS IR SOLN
Status: DC | PRN
  Administered 2019-04-15: 1000 mL

## 2019-04-15 MED ORDER — HEPARIN SODIUM (PORCINE) 5000 UNIT/ML IJ SOLN
5000.0000 [IU] | Freq: Once | INTRAMUSCULAR | Status: AC
  Administered 2019-04-15: 5000 [IU] via SUBCUTANEOUS
  Filled 2019-04-15: qty 1

## 2019-04-15 MED ORDER — TRAMADOL HCL 50 MG PO TABS: 50.0000 mg | ORAL_TABLET | Freq: Four times a day (QID) | ORAL | Status: DC | PRN

## 2019-04-15 MED ORDER — PHENYLEPHRINE 40 MCG/ML (10ML) SYRINGE FOR IV PUSH (FOR BLOOD PRESSURE SUPPORT)
PREFILLED_SYRINGE | INTRAVENOUS | Status: AC
  Filled 2019-04-15: qty 10

## 2019-04-15 MED ORDER — FENTANYL CITRATE (PF) 100 MCG/2ML IJ SOLN: 25.0000 ug | INTRAMUSCULAR | Status: DC | PRN

## 2019-04-15 MED ORDER — LIDOCAINE HCL 2 % IJ SOLN
INTRAMUSCULAR | Status: AC
  Filled 2019-04-15: qty 20

## 2019-04-15 MED ORDER — ROCURONIUM BROMIDE 10 MG/ML (PF) SYRINGE
PREFILLED_SYRINGE | INTRAVENOUS | Status: AC
  Filled 2019-04-15: qty 10

## 2019-04-15 MED ORDER — BUPIVACAINE HCL 0.25 % IJ SOLN
INTRAMUSCULAR | Status: AC
  Filled 2019-04-15: qty 1

## 2019-04-15 MED ORDER — SODIUM CHLORIDE 0.9 % IV SOLN
2.0000 g | INTRAVENOUS | Status: AC
  Administered 2019-04-15: 2 g via INTRAVENOUS
  Filled 2019-04-15: qty 2

## 2019-04-15 MED ORDER — LIDOCAINE 2% (20 MG/ML) 5 ML SYRINGE
INTRAMUSCULAR | Status: AC
  Filled 2019-04-15: qty 5

## 2019-04-15 MED ORDER — SUGAMMADEX SODIUM 200 MG/2ML IV SOLN
INTRAVENOUS | Status: DC | PRN
  Administered 2019-04-15: 150 mg via INTRAVENOUS

## 2019-04-15 MED ORDER — DEXAMETHASONE SODIUM PHOSPHATE 10 MG/ML IJ SOLN
INTRAMUSCULAR | Status: DC | PRN
  Administered 2019-04-15: 10 mg via INTRAVENOUS

## 2019-04-15 MED ORDER — ONDANSETRON 4 MG PO TBDP: 4.0000 mg | ORAL_TABLET | Freq: Four times a day (QID) | ORAL | Status: DC | PRN

## 2019-04-15 MED ORDER — LIDOCAINE 2% (20 MG/ML) 5 ML SYRINGE
INTRAMUSCULAR | Status: DC | PRN
  Administered 2019-04-15: 60 mg via INTRAVENOUS

## 2019-04-15 MED ORDER — DEXAMETHASONE SODIUM PHOSPHATE 10 MG/ML IJ SOLN
INTRAMUSCULAR | Status: AC
  Filled 2019-04-15: qty 1

## 2019-04-15 MED ORDER — HYDROMORPHONE HCL 1 MG/ML IJ SOLN: 0.5000 mg | INTRAMUSCULAR | Status: DC | PRN

## 2019-04-15 MED ORDER — ONDANSETRON HCL 4 MG/2ML IJ SOLN: 4.0000 mg | Freq: Once | INTRAMUSCULAR | Status: DC | PRN

## 2019-04-15 MED ORDER — PHENYLEPHRINE 40 MCG/ML (10ML) SYRINGE FOR IV PUSH (FOR BLOOD PRESSURE SUPPORT)
PREFILLED_SYRINGE | INTRAVENOUS | Status: DC | PRN
  Administered 2019-04-15 (×2): 80 ug via INTRAVENOUS

## 2019-04-15 MED ORDER — ONDANSETRON HCL 4 MG/2ML IJ SOLN
INTRAMUSCULAR | Status: DC | PRN
  Administered 2019-04-15: 4 mg via INTRAVENOUS

## 2019-04-15 MED ORDER — FLUOXETINE HCL 20 MG PO CAPS
40.0000 mg | ORAL_CAPSULE | Freq: Every day | ORAL | Status: DC
  Administered 2019-04-16 – 2019-04-18 (×3): 40 mg via ORAL
  Filled 2019-04-15 (×3): qty 2

## 2019-04-15 MED ORDER — ACETAMINOPHEN 500 MG PO TABS
1000.0000 mg | ORAL_TABLET | ORAL | Status: AC
  Administered 2019-04-15: 1000 mg via ORAL
  Filled 2019-04-15: qty 2

## 2019-04-15 MED ORDER — ONDANSETRON HCL 4 MG/2ML IJ SOLN
INTRAMUSCULAR | Status: AC
  Filled 2019-04-15: qty 2

## 2019-04-15 MED ORDER — PROPOFOL 1000 MG/100ML IV EMUL
INTRAVENOUS | Status: AC
  Filled 2019-04-15: qty 100

## 2019-04-15 MED ORDER — MAGNESIUM OXIDE 400 (241.3 MG) MG PO TABS
200.0000 mg | ORAL_TABLET | ORAL | Status: DC
  Administered 2019-04-18: 200 mg via ORAL
  Filled 2019-04-15: qty 1

## 2019-04-15 MED ORDER — ROCURONIUM BROMIDE 10 MG/ML (PF) SYRINGE
PREFILLED_SYRINGE | INTRAVENOUS | Status: DC | PRN
  Administered 2019-04-15: 10 mg via INTRAVENOUS
  Administered 2019-04-15: 40 mg via INTRAVENOUS

## 2019-04-15 MED ORDER — FENTANYL CITRATE (PF) 100 MCG/2ML IJ SOLN
INTRAMUSCULAR | Status: AC
  Filled 2019-04-15: qty 2

## 2019-04-15 MED ORDER — PROPOFOL 500 MG/50ML IV EMUL
INTRAVENOUS | Status: DC | PRN
  Administered 2019-04-15: 25 ug/kg/min via INTRAVENOUS

## 2019-04-15 MED ORDER — BUPIVACAINE LIPOSOME 1.3 % IJ SUSP
INTRAMUSCULAR | Status: DC | PRN
  Administered 2019-04-15: 20 mL

## 2019-04-15 MED ORDER — BUPIVACAINE HCL (PF) 0.25 % IJ SOLN
INTRAMUSCULAR | Status: DC | PRN
  Administered 2019-04-15: 10 mL

## 2019-04-15 MED ORDER — FENTANYL CITRATE (PF) 100 MCG/2ML IJ SOLN
INTRAMUSCULAR | Status: DC | PRN
  Administered 2019-04-15 (×2): 50 ug via INTRAVENOUS

## 2019-04-15 MED ORDER — 0.9 % SODIUM CHLORIDE (POUR BTL) OPTIME
TOPICAL | Status: DC | PRN
  Administered 2019-04-15: 1000 mL

## 2019-04-15 MED ORDER — ACETAMINOPHEN 500 MG PO TABS
1000.0000 mg | ORAL_TABLET | Freq: Four times a day (QID) | ORAL | Status: DC
  Administered 2019-04-15 – 2019-04-16 (×4): 1000 mg via ORAL
  Filled 2019-04-15 (×6): qty 2

## 2019-04-15 MED ORDER — KCL IN DEXTROSE-NACL 20-5-0.45 MEQ/L-%-% IV SOLN
INTRAVENOUS | Status: DC
  Filled 2019-04-15: qty 1000

## 2019-04-15 MED ORDER — SIMETHICONE 80 MG PO CHEW: 40.0000 mg | CHEWABLE_TABLET | Freq: Four times a day (QID) | ORAL | Status: DC | PRN

## 2019-04-15 MED ORDER — LACTATED RINGERS IV SOLN: INTRAVENOUS | Status: DC

## 2019-04-15 MED ORDER — DIPHENHYDRAMINE HCL 50 MG/ML IJ SOLN: 12.5000 mg | Freq: Four times a day (QID) | INTRAMUSCULAR | Status: DC | PRN

## 2019-04-15 MED ORDER — FUROSEMIDE 20 MG PO TABS: 20.0000 mg | ORAL_TABLET | Freq: Every day | ORAL | Status: DC | PRN

## 2019-04-15 MED ORDER — ONDANSETRON HCL 4 MG/2ML IJ SOLN: 4.0000 mg | Freq: Four times a day (QID) | INTRAMUSCULAR | Status: DC | PRN

## 2019-04-15 MED ORDER — ENOXAPARIN SODIUM 40 MG/0.4ML ~~LOC~~ SOLN
40.0000 mg | SUBCUTANEOUS | Status: DC
  Administered 2019-04-16 – 2019-04-18 (×3): 40 mg via SUBCUTANEOUS
  Filled 2019-04-15 (×3): qty 0.4

## 2019-04-15 MED ORDER — EPHEDRINE SULFATE-NACL 50-0.9 MG/10ML-% IV SOSY
PREFILLED_SYRINGE | INTRAVENOUS | Status: DC | PRN
  Administered 2019-04-15 (×3): 10 mg via INTRAVENOUS

## 2019-04-15 SURGICAL SUPPLY — 58 items
BLADE SURG SZ11 CARB STEEL (BLADE) ×3 IMPLANT
COVER BACK TABLE 60X90IN (DRAPES) ×3 IMPLANT
COVER MAYO STAND STRL (DRAPES) ×3 IMPLANT
COVER SURGICAL LIGHT HANDLE (MISCELLANEOUS) ×3 IMPLANT
COVER TIP SHEARS 8 DVNC (MISCELLANEOUS) ×1 IMPLANT
COVER TIP SHEARS 8MM DA VINCI (MISCELLANEOUS) ×2
COVER WAND RF STERILE (DRAPES) ×3 IMPLANT
DECANTER SPIKE VIAL GLASS SM (MISCELLANEOUS) ×3 IMPLANT
DERMABOND ADVANCED (GAUZE/BANDAGES/DRESSINGS) ×2
DERMABOND ADVANCED .7 DNX12 (GAUZE/BANDAGES/DRESSINGS) ×1 IMPLANT
DRAPE ARM DVNC X/XI (DISPOSABLE) ×4 IMPLANT
DRAPE COLUMN DVNC XI (DISPOSABLE) ×1 IMPLANT
DRAPE DA VINCI XI ARM (DISPOSABLE) ×8
DRAPE DA VINCI XI COLUMN (DISPOSABLE) ×2
DRAPE SHEET LG 3/4 BI-LAMINATE (DRAPES) ×3 IMPLANT
DRAPE SURG IRRIG POUCH 19X23 (DRAPES) ×3 IMPLANT
DRAPE UTILITY XL STRL (DRAPES) ×6 IMPLANT
ELECT PENCIL ROCKER SW 15FT (MISCELLANEOUS) ×3 IMPLANT
ELECT REM PT RETURN 15FT ADLT (MISCELLANEOUS) ×3 IMPLANT
HOLDER FOLEY CATH W/STRAP (MISCELLANEOUS) ×3 IMPLANT
KIT BASIN OR (CUSTOM PROCEDURE TRAY) ×3 IMPLANT
KIT PROCEDURE DA VINCI SI (MISCELLANEOUS)
KIT PROCEDURE DVNC SI (MISCELLANEOUS) IMPLANT
KIT TURNOVER KIT A (KITS) ×3 IMPLANT
LEGGING LITHOTOMY PAIR STRL (DRAPES) ×3 IMPLANT
NEEDLE HYPO 22GX1.5 SAFETY (NEEDLE) ×3 IMPLANT
NEEDLE INSUFFLATION 14GA 120MM (NEEDLE) ×3 IMPLANT
PACK CARDIOVASCULAR III (CUSTOM PROCEDURE TRAY) ×3 IMPLANT
PAD POSITIONING PINK XL (MISCELLANEOUS) ×3 IMPLANT
PROTECTOR NERVE ULNAR (MISCELLANEOUS) ×6 IMPLANT
SCISSORS LAP 5X45 EPIX DISP (ENDOMECHANICALS) ×3 IMPLANT
SEAL CANN UNIV 5-8 DVNC XI (MISCELLANEOUS) ×4 IMPLANT
SEAL XI 5MM-8MM UNIVERSAL (MISCELLANEOUS) ×8
SEALER VESSEL DA VINCI XI (MISCELLANEOUS) ×2
SEALER VESSEL EXT DVNC XI (MISCELLANEOUS) ×1 IMPLANT
SET IRRIG TUBING LAPAROSCOPIC (IRRIGATION / IRRIGATOR) ×3 IMPLANT
SLEEVE ADV FIXATION 5X100MM (TROCAR) IMPLANT
SOL ANTI FOG 6CC (MISCELLANEOUS) ×1 IMPLANT
SOLUTION ANTI FOG 6CC (MISCELLANEOUS) ×2
SOLUTION ELECTROLUBE (MISCELLANEOUS) ×3 IMPLANT
SPONGE LAP 18X18 RF (DISPOSABLE) ×3 IMPLANT
SUT ETHIBOND 0 36 GRN (SUTURE) IMPLANT
SUT ETHIBOND 2 0 SH (SUTURE) ×4
SUT ETHIBOND 2 0 SH 36X2 (SUTURE) ×2 IMPLANT
SUT V-LOC BARB 180 2/0GR6 GS22 (SUTURE) ×6
SUT VIC AB 4-0 PS2 27 (SUTURE) ×3 IMPLANT
SUT VLOC 180 2-0 9IN GS21 (SUTURE) ×6 IMPLANT
SUTURE V-LC BRB 180 2/0GR6GS22 (SUTURE) ×2 IMPLANT
SYR 20ML LL LF (SYRINGE) ×3 IMPLANT
SYR CONTROL 10ML LL (SYRINGE) ×3 IMPLANT
TOWEL OR 17X26 10 PK STRL BLUE (TOWEL DISPOSABLE) ×3 IMPLANT
TOWEL OR NON WOVEN STRL DISP B (DISPOSABLE) ×3 IMPLANT
TRAY FOLEY MTR SLVR 14FR STAT (SET/KITS/TRAYS/PACK) IMPLANT
TRAY FOLEY MTR SLVR 16FR STAT (SET/KITS/TRAYS/PACK) ×3 IMPLANT
TROCAR ADV FIXATION 5X100MM (TROCAR) ×3 IMPLANT
TUBING CONNECTING 10 (TUBING) ×2 IMPLANT
TUBING CONNECTING 10' (TUBING) ×1
TUBING INSUFFLATION 10FT LAP (TUBING) ×3 IMPLANT

## 2019-04-15 NOTE — Anesthesia Procedure Notes (Signed)
Procedure Name: Intubation Date/Time: 04/15/2019 7:39 AM Performed by: Yeriel Mineo D, CRNA Pre-anesthesia Checklist: Patient identified, Emergency Drugs available, Suction available and Patient being monitored Patient Re-evaluated:Patient Re-evaluated prior to induction Oxygen Delivery Method: Circle system utilized Preoxygenation: Pre-oxygenation with 100% oxygen Induction Type: IV induction Ventilation: Mask ventilation without difficulty Laryngoscope Size: Mac and 3 Grade View: Grade I Tube type: Oral Tube size: 7.5 mm Number of attempts: 1 Airway Equipment and Method: Stylet Placement Confirmation: ETT inserted through vocal cords under direct vision,  positive ETCO2 and breath sounds checked- equal and bilateral Secured at: 21 cm Tube secured with: Tape Dental Injury: Teeth and Oropharynx as per pre-operative assessment

## 2019-04-15 NOTE — Transfer of Care (Signed)
Immediate Anesthesia Transfer of Care Note  Patient: Brittney Miller  Procedure(s) Performed: XI ROBOT ASSISTED RECTOPEXY (N/A )  Patient Location: PACU  Anesthesia Type:General  Level of Consciousness: sedated and drowsy  Airway & Oxygen Therapy: Patient Spontanous Breathing and Patient connected to face mask oxygen  Post-op Assessment: Report given to RN and Post -op Vital signs reviewed and stable  Post vital signs: Reviewed and stable  Last Vitals:  Vitals Value Taken Time  BP 143/65 04/15/19 0956  Temp    Pulse 76 04/15/19 0958  Resp    SpO2 100 % 04/15/19 0958  Vitals shown include unvalidated device data.  Last Pain:  Vitals:   04/15/19 0633  TempSrc:   PainSc: 0-No pain         Complications: No apparent anesthesia complications

## 2019-04-15 NOTE — Anesthesia Postprocedure Evaluation (Signed)
Anesthesia Post Note  Patient: Brittney Miller  Procedure(s) Performed: XI ROBOT ASSISTED RECTOPEXY (N/A )     Patient location during evaluation: PACU Anesthesia Type: General Level of consciousness: awake and alert Pain management: pain level controlled Vital Signs Assessment: post-procedure vital signs reviewed and stable Respiratory status: spontaneous breathing, nonlabored ventilation and respiratory function stable Cardiovascular status: blood pressure returned to baseline and stable Postop Assessment: no apparent nausea or vomiting Anesthetic complications: no    Last Vitals:  Vitals:   04/15/19 1115 04/15/19 1141  BP: 121/62 131/69  Pulse: 75 76  Resp: 19 16  Temp:  36.6 C  SpO2: 95% 100%    Last Pain:  Vitals:   04/15/19 1141  TempSrc: Oral  PainSc:                  Catalina Gravel

## 2019-04-15 NOTE — H&P (Signed)
The patient is a 79 year old female who presents with rectal prolapse. 79 year old female who presents to the office with rectal prolapse. She recently underwent a colonoscopy for rectal bleeding and this showed no signs of malignancy, but rectal inflammation consistent with prolapse. Patient has a history of loose stools and diarrhea. No major h/o constipation that she can remember. She denies any major constipation issues. Surgical history is significant for cholecystectomy and probable appendectomy. She also believes she has had a hysterectomy in the past. She is having fecal incontinence   Past Surgical History Mammie Lorenzo, LPN; 624THL 579FGE AM) Cataract Surgery  Bilateral. Foot Surgery  Bilateral. Gallbladder Surgery - Open   Allergies Geni Bers Haggett, RMA; 02/28/2019 9:34 AM) No Known Drug Allergies [02/28/2019]: Allergies Reconciled   Medication History [employer], RMA; 02/28/2019 9:35 AM) FLUoxetine HCl (40MG  Capsule, Oral) Active. Furosemide (20MG  Tablet, Oral) Active. Aspirin (81MG  Tablet DR, Oral) Active. Fish Oil (Oral) Specific strength unknown - Active. Multi Vitamin (Oral) Active. Vitamin B12 (Oral) Specific strength unknown - Active. Medications Reconciled  Social History Mammie Lorenzo, LPN; 624THL 579FGE AM) No alcohol use  No caffeine use  No drug use  Tobacco use  Never smoker.  Pregnancy / Birth History Mammie Lorenzo, LPN; 624THL 579FGE AM) Age at menarche  56 years.  Other Problems Mammie Lorenzo, LPN; 624THL 579FGE AM) Depression     Review of Systems  General Not Present- Appetite Loss, Chills, Fatigue, Fever, Night Sweats, Weight Gain and Weight Loss. Skin Not Present- Change in Wart/Mole, Dryness, Hives, Jaundice, New Lesions, Non-Healing Wounds, Rash and Ulcer. HEENT Present- Ringing in the Ears and Wears glasses/contact lenses. Not Present- Earache, Hearing Loss, Hoarseness, Nose Bleed, Oral Ulcers, Seasonal  Allergies, Sinus Pain, Sore Throat, Visual Disturbances and Yellow Eyes. Respiratory Not Present- Bloody sputum, Chronic Cough, Difficulty Breathing, Snoring and Wheezing. Breast Not Present- Breast Mass, Breast Pain, Nipple Discharge and Skin Changes. Cardiovascular Present- Leg Cramps. Not Present- Chest Pain, Difficulty Breathing Lying Down, Palpitations, Rapid Heart Rate, Shortness of Breath and Swelling of Extremities. Gastrointestinal Not Present- Abdominal Pain, Bloating, Bloody Stool, Change in Bowel Habits, Chronic diarrhea, Constipation, Difficulty Swallowing, Excessive gas, Gets full quickly at meals, Hemorrhoids, Indigestion, Nausea, Rectal Pain and Vomiting. Female Genitourinary Not Present- Frequency, Nocturia, Painful Urination, Pelvic Pain and Urgency. Musculoskeletal Not Present- Back Pain, Joint Pain, Joint Stiffness, Muscle Pain, Muscle Weakness and Swelling of Extremities. Neurological Not Present- Decreased Memory, Fainting, Headaches, Numbness, Seizures, Tingling, Tremor, Trouble walking and Weakness. Psychiatric Present- Depression. Not Present- Anxiety, Bipolar, Change in Sleep Pattern, Fearful and Frequent crying. Endocrine Not Present- Cold Intolerance, Excessive Hunger, Hair Changes, Heat Intolerance, Hot flashes and New Diabetes. Hematology Not Present- Blood Thinners, Easy Bruising, Excessive bleeding, Gland problems, HIV and Persistent Infections.   BP 117/65   Pulse 68   Temp 98.1 F (36.7 C) (Oral)   Resp 16   LMP  (LMP Unknown)   SpO2 100%    Physical Exam  General Mental Status-Alert. General Appearance-Cooperative. CV: RRR Abdomen Palpation/Percussion Palpation and Percussion of the abdomen reveal - Soft and Non Tender.  Rectal Anorectal Exam External - Note: skin excoriation circumferentially. Internal - decreased sphincter tone(good squeeze pressure).    Assessment & Plan  RECTAL PROLAPSE (K62.3) Impression: 79 year old female who  presents to the office with rectal prolapse and fecal incontinence. Her colonoscopy was neg for malignancy. This is clearly demonstrated on exam today. It sounds like she has more loose stools than constipation. I think she would benefit from  a robotic-assisted rectopexy without resection. Given her history of incontinence and loose stools. Hopefully, this will cause her to have better control from her significantly diminished resting tone, although I cannot guarantee she will have complete control of her bowels. On exam, she does have good squeeze pressure, and I don't think she has any definitive needs for a colostomy.  This patient encounter took 45 minutes today to perform the following: Take history, perform exam, review outside records, interpret imaging, counseled the patient on her diagnosis and document encounter, findings and plan in the EHR.  We will have her obtain cardiac clearance. After thatm we schedule her surgery as soon as available. The surgery and anatomy were described to the patient as well as the risks of surgery and the possible complications. These include: Bleeding, deep abdominal infections and possible wound complications such as hernia and infection, damage to adjacent structures, leak of surgical connections, which can lead to other surgeries and possibly an ostomy, possible need for other procedures, such as abscess drains in radiology, possible prolonged hospital stay, possible diarrhea from removal of part of the colon, possible constipation from narcotics, possible bowel, bladder or sexual dysfunction if having rectal surgery, prolonged fatigue/weakness or appetite loss, possible early recurrence of disease, possible complications of their medical problems such as heart disease or arrhythmias or lung problems, death (less than 1%). I believe the patient understands and wishes to proceed with the surgery.

## 2019-04-15 NOTE — Op Note (Signed)
04/15/2019  9:33 AM  PATIENT:  Brittney Miller  79 y.o. female  Patient Care Team: Marrian Salvage, [place] as PCP - General (Internal Medicine)  PRE-OPERATIVE DIAGNOSIS:  RECTAL PROLAPSE  POST-OPERATIVE DIAGNOSIS:  RECTAL PROLAPSE  PROCEDURE:  XI ROBOT ASSISTED RECTOPEXY   Surgeon(s): Leighton Ruff, MD Michael Boston, MD  ASSISTANT: Dr Johney Maine   ANESTHESIA:   local and general  EBL: 40ml  Total I/O In: 1100 [I.V.:1000; IV Piggyback:100] Out: -   Delay start of Pharmacological VTE agent (>24hrs) due to surgical blood loss or risk of bleeding:  no  DRAINS: none   SPECIMEN:  Source of Specimen:  none  DISPOSITION OF SPECIMEN:  N/A  COUNTS:  YES  PLAN OF CARE: Admit to inpatient   PATIENT DISPOSITION:  PACU - hemodynamically stable.  INDICATION:    79 y.o. F with rectal prolapse without chronic constipation   I recommended rectopexy. The anatomy & physiology of the digestive tract was discussed.  The pathophysiology was discussed.  Natural history risks without surgery was discussed.   I worked to give an overview of the disease and the frequent need to have multispecialty involvement.  I feel the risks of no intervention will lead to serious problems that outweigh the operative risks; therefore, I recommended a partial colectomy to remove the pathology.  Risks such as bleeding, infection, abscess, leak, reoperation, possible ostomy, hernia, heart attack, death, and other risks were discussed.  I noted a good likelihood this will help address the problem.   Goals of post-operative recovery were discussed as well.    The patient expressed understanding & wished to proceed with surgery.  OR FINDINGS:   Patient had minimal sigmoid redundancy   DESCRIPTION:   Informed consent was confirmed.  The patient underwent general anaesthesia without difficulty.  The patient was positioned appropriately.  VTE prevention in place.  The patient's abdomen was clipped,  prepped, & draped in a sterile fashion.  Surgical timeout confirmed our plan.  The patient was positioned in reverse Trendelenburg.  Abdominal entry was gained using a Varies needle in the LUQ.  Entry was clean.  I induced carbon dioxide insufflation.  An 15mm robotic port was placed in the RUQ.  Camera inspection revealed no injury.  Extra ports were carefully placed under direct laparoscopic visualization.  I laparoscopically reflected the greater omentum and the upper abdomen the small bowel in the upper abdomen. The patient was appropriately positioned and the robot was docked to the patient's left side.  Instruments were placed under direct visualization.   I scored the base of peritoneum of the right side of the mesentery of the left colon from the ligament of Treitz to the peritoneal reflection of the mid rectum.  I elevated the sigmoid mesentery and enetered into the retro-mesenteric plane.  We elevated the left colon mesentery off that.  I continued distally and got into the avascular plane posterior to the mesorectum. This allowed me to help mobilize the rectum as well by freeing the mesorectum off the sacrum.  I mobilized the peritoneal coverings towards the peritoneal reflection on both the right and left sides of the rectum.  I also divided the posterior reflection using cautery.  I could see the right and left ureters and stayed away from them.   I continued my dissection posteriorly bluntly mobilizing the mesorectum off of the pelvic floor.  Once down to the pelvic floor musculature, the left lateral mesentery was divided.  I left the right stalk  intact.  I mobilized anteriorly a few centimeters to allow for mobilization of the peritoneal reflection to the sacral promontory.  Once this was completed I used a V-Loc suture to affix the peritoneal reflection to the middle of the sacral promontory.  I then used 2 Ethibond sutures to further fixate the peritoneal reflection to the sacral promontory at  midline to avoid injury to the pelvic nerves.  Hemostasis was good.  This applied good fixation of the rectum without any prolapse on digital rectal exam.  I then used another V-Loc suture to reapproximate the peritoneum coverings and avoid a small bowel hernia.  Hemostasis was good in the pelvis and therefore I did not leave a drain.  All needles were removed.  The robot instruments were removed.  The robot was undocked.  The abdomen was inspected.  There were no other signs of injury or bleeding.  The ports were removed and the abdomen was desufflated.  Port sites were closed using a 4-0 Vicryl subcuticular suture and Dermabond.  Patient tolerated the procedure well was sent to the postanesthesia care unit in stable condition.  All counts were correct per operating room staff.  An MD assistant was necessary for tissue manipulation, retraction and positioning due to the complexity of the case and hospital policies

## 2019-04-15 NOTE — Plan of Care (Signed)
Plan of care 

## 2019-04-16 LAB — CBC
HCT: 33.1 % — ABNORMAL LOW (ref 36.0–46.0)
Hemoglobin: 10.6 g/dL — ABNORMAL LOW (ref 12.0–15.0)
MCH: 29.6 pg (ref 26.0–34.0)
MCHC: 32 g/dL (ref 30.0–36.0)
MCV: 92.5 fL (ref 80.0–100.0)
Platelets: 281 10*3/uL (ref 150–400)
RBC: 3.58 MIL/uL — ABNORMAL LOW (ref 3.87–5.11)
RDW: 13.6 % (ref 11.5–15.5)
WBC: 8.8 10*3/uL (ref 4.0–10.5)
nRBC: 0 % (ref 0.0–0.2)

## 2019-04-16 LAB — BASIC METABOLIC PANEL
Anion gap: 10 (ref 5–15)
BUN: 15 mg/dL (ref 8–23)
CO2: 23 mmol/L (ref 22–32)
Calcium: 8.2 mg/dL — ABNORMAL LOW (ref 8.9–10.3)
Chloride: 101 mmol/L (ref 98–111)
Creatinine, Ser: 0.9 mg/dL (ref 0.44–1.00)
GFR calc Af Amer: >60 mL/min (ref >60)
GFR calc non Af Amer: >60 mL/min (ref >60)
Glucose, Bld: 98 mg/dL (ref 70–99)
Potassium: 3.6 mmol/L (ref 3.5–5.1)
Sodium: 134 mmol/L — ABNORMAL LOW (ref 135–145)

## 2019-04-16 MED ORDER — CHLORHEXIDINE GLUCONATE CLOTH 2 % EX PADS
6.0000 | MEDICATED_PAD | Freq: Every day | CUTANEOUS | Status: DC
  Administered 2019-04-17: 6 via TOPICAL

## 2019-04-16 NOTE — Evaluation (Signed)
Physical Therapy Evaluation Patient Details Name: Brittney Miller MRN: [id number] DOB: 1940/10/29 Today's Date: 04/16/2019   History of Present Illness  79yo female presenting with rectal prolapse, fecal incontinence. Received robot assisted rectopexy 04/15/19. PMH CKD, anxiety, cholecystectomy  Clinical Impression   Patient received in bed, very pleasant and HOH today; A&Ox4 but does seem a little functionally confused- states "I fall by tripping on things at home, but I never fall without a reason!". See below for mobility/assist levels. Tolerated mobility well today, was a bit impulsive during session but this might have been due to combination of eagerness to get OOB and HOH, regardless needed VC for safety. Mild dizziness with positional changes but recovered well with static rest prior to mobility. She was left up in the chair with all needs met, chair alarm active. Feel she can likely return home with S for mobility and skilled OP PT services to address balance and strength.     Follow Up Recommendations Outpatient PT;Supervision for mobility/OOB    Equipment Recommendations  Rolling walker with 5" wheels    Recommendations for Other Services       Precautions / Restrictions Precautions Precautions: Fall;Other (comment) Precaution Comments: abdominal incisions, fecal incontinence (noted in MD note, did not have any during eval) Restrictions Weight Bearing Restrictions: No      Mobility  Bed Mobility Overal bed mobility: Needs Assistance Bed Mobility: Rolling;Sidelying to Sit Rolling: Supervision Sidelying to sit: Supervision       General bed mobility comments: VC for techinque to protect abdomen, no physical assist given  Transfers Overall transfer level: Needs assistance Equipment used: Rolling walker (2 wheeled) Transfers: Sit to/from Stand Sit to Stand: Min guard         General transfer comment: for safety, no physical assist  given  Ambulation/Gait Ambulation/Gait assistance: Min guard Gait Distance (Feet): 200 Feet Assistive device: Rolling walker (2 wheeled) Gait Pattern/deviations: Step-through pattern;Trunk flexed Gait velocity: decreased   General Gait Details: kyphotic posture, needed cues for looking upright and safe distance from RW; also cues for navigation in hallway  Stairs            Wheelchair Mobility    Modified Rankin (Stroke Patients Only)       Balance Overall balance assessment: History of Falls;Needs assistance Sitting-balance support: Feet supported Sitting balance-Leahy Scale: Normal Sitting balance - Comments: able to reach down to pull up socks   Standing balance support: Bilateral upper extremity supported;During functional activity Standing balance-Leahy Scale: Good Standing balance comment: min guard for safety, steady with RW                             Pertinent Vitals/Pain Pain Assessment: No/denies pain    Home Living Family/patient expects to be discharged to:: Private residence Living Arrangements: Children Available Help at Discharge: Family;Available PRN/intermittently Type of Home: House Home Access: Ramped entrance;Stairs to enter Entrance Stairs-Rails: None Entrance Stairs-Number of Steps: 3 in the front no rails, ramp in the back Home Layout: One level Home Equipment: None Additional Comments: states no equipment but her children have told her she should use them bc she falls a lot- but states "I never fall without a reason"    Prior Function Level of Independence: Independent         Comments: frequent falls, tripping     Hand Dominance        Extremity/Trunk Assessment   Upper Extremity Assessment Upper Extremity  Assessment: Overall WFL for tasks assessed    Lower Extremity Assessment Lower Extremity Assessment: Overall WFL for tasks assessed    Cervical / Trunk Assessment Cervical / Trunk Assessment: Kyphotic   Communication   Communication: No difficulties  Cognition Arousal/Alertness: Awake/alert Behavior During Therapy: Impulsive Overall Cognitive Status: No family/caregiver present to determine baseline cognitive functioning Area of Impairment: Attention;Safety/judgement;Orientation;Following commands                 Orientation Level: Person;Place;Time;Situation Current Attention Level: Selective   Following Commands: Follows one step commands consistently;Follows one step commands with increased time Safety/Judgement: Decreased awareness of safety     General Comments: impulsive, required multiple cues to pace activity today      General Comments      Exercises     Assessment/Plan    PT Assessment Patient needs continued PT services  PT Problem List Decreased strength;Decreased safety awareness;Decreased balance;Decreased knowledge of use of DME;Decreased knowledge of precautions;Decreased coordination       PT Treatment Interventions DME instruction;Balance training;Gait training;Stair training;Functional mobility training;Patient/family education;Therapeutic exercise;Therapeutic activities    PT Goals (Current goals can be found in the Care Plan section)  Acute Rehab PT Goals Patient Stated Goal: go home PT Goal Formulation: With patient Time For Goal Achievement: 04/29/19 Potential to Achieve Goals: Good    Frequency Min 3X/week   Barriers to discharge        Co-evaluation               AM-PAC PT "6 Clicks" Mobility  Outcome Measure Help needed turning from your back to your side while in a flat bed without using bedrails?: None   Help needed moving to and from a bed to a chair (including a wheelchair)?: A Little Help needed standing up from a chair using your arms (e.g., wheelchair or bedside chair)?: A Little Help needed to walk in hospital room?: A Little Help needed climbing 3-5 steps with a railing? : A Little 6 Click Score: 16    End of  Session Equipment Utilized During Treatment: Gait belt Activity Tolerance: Patient tolerated treatment well Patient left: in chair;with call bell/phone within reach;with chair alarm set   PT Visit Diagnosis: History of falling (Z91.81);Unsteadiness on feet (R26.81);Muscle weakness (generalized) (M62.81)    Time: 8341-9622 PT Time Calculation (min) (ACUTE ONLY): 25 min   Charges:   PT Evaluation $PT Eval Low Complexity: 1 Low PT Treatments $Gait Training: 8-22 mins        Windell Norfolk, DPT, PN1   Supplemental Physical Therapist [place]    Pager [phone] Acute Rehab Office [phone]

## 2019-04-16 NOTE — Progress Notes (Signed)
1 Day Post-Op robotic rectopexy Subjective: No pain, no nausea.  Passing flatus.  No BM  Objective: Vital signs in last 24 hours: Temp:  [97.4 F (36.3 C)-98.3 F (36.8 C)] 98.1 F (36.7 C) (02/20 0604) Pulse Rate:  [63-78] 70 (02/20 0604) Resp:  [16-27] 18 (02/20 0604) BP: (95-143)/(53-86) 110/55 (02/20 0604) SpO2:  [94 %-100 %] 97 % (02/20 0604) Weight:  [70 kg] 70 kg (02/19 1145)   Intake/Output from previous day: 02/19 0701 - 02/20 0700 In: 2586.7 [P.O.:1020; I.V.:1466.7; IV Piggyback:100] Out: 1410 [Urine:1410] Intake/Output this shift: No intake/output data recorded.   General appearance: alert and cooperative GI: normal findings: soft, non-distended  Incision: no significant drainage  Lab Results:  Recent Labs    04/16/19 0428  WBC 8.8  HGB 10.6*  HCT 33.1*  PLT 281   BMET Recent Labs    04/16/19 0428  NA 134*  K 3.6  CL 101  CO2 23  GLUCOSE 98  BUN 15  CREATININE 0.90  CALCIUM 8.2*   PT/INR No results for input(s): LABPROT, INR in the last 72 hours. ABG No results for input(s): PHART, HCO3 in the last 72 hours.  Invalid input(s): PCO2, PO2  MEDS, Scheduled . acetaminophen  1,000 mg Oral Q6H  . enoxaparin (LOVENOX) injection  40 mg Subcutaneous Q24H  . FLUoxetine  40 mg Oral Daily  . [START ON 04/18/2019] magnesium oxide  200 mg Oral Once per day on Mon Thu    Studies/Results: No results found.  Assessment: s/p Procedure(s): XI ROBOT ASSISTED RECTOPEXY Patient Active Problem List   Diagnosis Date Noted  . Rectal prolapse 04/15/2019  . Swelling of lower leg 10/25/2018  . Word finding difficulty 07/09/2017  . Wellness examination 10/09/2014  . Depression 10/09/2014  . Dyslipidemia 10/09/2014  . Osteoporosis 10/09/2014  . IBS (irritable bowel syndrome) 10/09/2014  . Osteoarthritis 10/09/2014  . THIRST 10/03/2008    Expected post op course  Plan: Advance diet  Ambulate   LOS: 1 day     .Rosario Adie, [facility], [place]    04/16/2019 8:23 AM

## 2019-04-17 MED ORDER — POLYETHYLENE GLYCOL 3350 17 G PO PACK
17.0000 g | PACK | Freq: Two times a day (BID) | ORAL | Status: DC
  Administered 2019-04-17 (×2): 17 g via ORAL
  Filled 2019-04-17 (×3): qty 1

## 2019-04-17 NOTE — Progress Notes (Signed)
2 Days Post-Op robotic rectopexy Subjective: No pain, no nausea.  Passing flatus.  No BM  Objective: Vital signs in last 24 hours: Temp:  [97.8 F (36.6 C)-98.3 F (36.8 C)] 97.8 F (36.6 C) (02/21 0504) Pulse Rate:  [70-72] 70 (02/21 0504) Resp:  [16-18] 16 (02/21 0504) BP: (106-130)/(56-75) 130/75 (02/21 0504) SpO2:  [96 %-97 %] 96 % (02/21 0504)   Intake/Output from previous day: 02/20 0701 - 02/21 0700 In: 720 [P.O.:720] Out: 1750 [Urine:1750] Intake/Output this shift: No intake/output data recorded.   General appearance: alert and cooperative GI: normal findings: soft, non-distended  Incision: no significant drainage  Lab Results:  Recent Labs    04/16/19 0428  WBC 8.8  HGB 10.6*  HCT 33.1*  PLT 281   BMET Recent Labs    04/16/19 0428  NA 134*  K 3.6  CL 101  CO2 23  GLUCOSE 98  BUN 15  CREATININE 0.90  CALCIUM 8.2*   PT/INR No results for input(s): LABPROT, INR in the last 72 hours. ABG No results for input(s): PHART, HCO3 in the last 72 hours.  Invalid input(s): PCO2, PO2  MEDS, Scheduled . acetaminophen  1,000 mg Oral Q6H  . Chlorhexidine Gluconate Cloth  6 each Topical Daily  . enoxaparin (LOVENOX) injection  40 mg Subcutaneous Q24H  . FLUoxetine  40 mg Oral Daily  . [START ON 04/18/2019] magnesium oxide  200 mg Oral Once per day on Mon Thu    Studies/Results: No results found.  Assessment: s/p Procedure(s): XI ROBOT ASSISTED RECTOPEXY Patient Active Problem List   Diagnosis Date Noted  . Rectal prolapse 04/15/2019  . Swelling of lower leg 10/25/2018  . Word finding difficulty 07/09/2017  . Wellness examination 10/09/2014  . Depression 10/09/2014  . Dyslipidemia 10/09/2014  . Osteoporosis 10/09/2014  . IBS (irritable bowel syndrome) 10/09/2014  . Osteoarthritis 10/09/2014  . THIRST 10/03/2008    Expected post op course  Plan: Cont diet Ambulate Await BM before d/c   LOS: 2 days     .Rosario Adie, MD [facility], [place]    04/17/2019 7:48 AM

## 2019-04-17 NOTE — Plan of Care (Signed)
  Problem: Clinical Measurements: Goal: Respiratory complications will improve Outcome: Progressing   Problem: Clinical Measurements: Goal: Cardiovascular complication will be avoided Outcome: Progressing   Problem: Activity: Goal: Risk for activity intolerance will decrease Outcome: Progressing   Problem: Coping: Goal: Level of anxiety will decrease Outcome: Progressing   

## 2019-04-17 NOTE — Progress Notes (Signed)
Pt stable at time of rounding. Pt denied pain and had no s/s of distress. Pt resting in bed. Rn will continue to monitor.

## 2019-04-18 ENCOUNTER — Encounter (HOSPITAL_COMMUNITY): Payer: Self-pay | Admitting: General Surgery

## 2019-04-18 NOTE — Discharge Summary (Signed)
Physician Discharge Summary  Patient ID: Brittney Miller MRN: [id number] DOB/AGE: [birth year] 79 y.o.  Admit date: 04/15/2019 Discharge date: 04/18/2019  Admission Diagnoses: rectal prolapse  Discharge Diagnoses:  Active Problems:   Rectal prolapse   Discharged Condition: good  Hospital Course: Pt admitted after surgery.  Diet was advanced.  She had not had any BM's by POD 2 so Miralax was started.  She had several BM's on POD 3 and was discharged to home.  Pt came to the hospital with an indwelling foley.  We will remove this and see if she can void on her own prior to d/c.  Consults: None  Significant Diagnostic Studies: labs: cbc, bmet   Treatments: IV hydration and surgery: robotic rectopexy  Discharge Exam: Blood pressure 127/67, pulse (!) 55, temperature 97.6 F (36.4 C), temperature source Oral, resp. rate 18, height 4\' 10"  (1.473 m), weight 70 kg, SpO2 96 %. General appearance: alert and cooperative GI: normal findings: soft, non-tender Incision/Wound: clean, dry, intact  Disposition: Discharge disposition: 01-Home or Self Care        Allergies as of 04/18/2019   No Known Allergies     Medication List    TAKE these medications   acetaminophen 500 MG tablet Commonly known as: TYLENOL Take 500 mg by mouth every 6 (six) hours as needed for mild pain.   aspirin EC 81 MG tablet Take 81 mg by mouth daily.   FLUoxetine 40 MG capsule Commonly known as: PROZAC Take 40 mg by mouth daily.   furosemide 20 MG tablet Commonly known as: LASIX Take 1 tablet (20 mg total) by mouth daily as needed. What changed: reasons to take this   MAGNESIUM PO Take 1 tablet by mouth 2 (two) times a week.   multivitamin tablet Take 1 tablet by mouth daily.      Follow-up Information    Leighton Ruff, MD. Schedule an appointment as soon as possible for a visit in 2 week(s).   Specialty: General Surgery Contact information: [address] [place]  [id number] [phone]           Signed: Rosario Adie [id number], 8:15 AM

## 2019-04-18 NOTE — Progress Notes (Signed)
Patient discharged to home w/ daughter. Given all belongings, instructions. Patient and dtr verbalized understanding of post op instructions and urine output/voiding instructions w/ instructions to return to ED if bladder distention or abdominal pain occurs. Escorted to pov via w/c.

## 2019-04-18 NOTE — Progress Notes (Signed)
Patient ambulated independently in room and with walker in hall. Patient voided post catheter removal and has had a BM. Patient states "I am ready to go home." Nurse will contact patient's daughter about discharge today to home.

## 2019-04-18 NOTE — Discharge Instructions (Signed)

## 2019-04-25 ENCOUNTER — Other Ambulatory Visit: Payer: Self-pay

## 2019-04-25 NOTE — Patient Outreach (Signed)
Red Emmi: Reason for alert: questions about discharge--yes  Placed call to patient and reviewed reason for call. Patient does not remember call but states she is doing well and does not need anything.  PLAN: close case as no needs identified.  Tomasa Rand, RN, BSN, CEN [facility] [employer] [phone]

## 2019-04-30 ENCOUNTER — Other Ambulatory Visit: Payer: Self-pay | Admitting: Family

## 2019-05-03 ENCOUNTER — Ambulatory Visit: Payer: Medicare Other | Attending: Internal Medicine

## 2019-05-03 DIAGNOSIS — Z23 Encounter for immunization: Secondary | ICD-10-CM | POA: Insufficient documentation

## 2019-05-03 NOTE — Progress Notes (Signed)
   Covid-19 Vaccination Clinic  Name:  Brittney Miller    MRN: [id number] DOB: [birth year]  05/03/2019  [name] was observed post Covid-19 immunization for 15 minutes without incident. She was provided with Vaccine Information Sheet and instruction to access the V-Safe system.   [name] was instructed to call 911 with any severe reactions post vaccine: Marland Kitchen Difficulty breathing  . Swelling of face and throat  . A fast heartbeat  . A bad rash all over body  . Dizziness and weakness   Immunizations Administered    Name Date Dose VIS Date Route   Pfizer COVID-19 Vaccine 05/03/2019  9:00 AM 0.3 mL 02/04/2019 Intramuscular   Manufacturer: [place]   Lot: [id number]   [place]: [id number]

## 2019-05-04 ENCOUNTER — Ambulatory Visit: Payer: Medicare Other

## 2019-07-24 ENCOUNTER — Other Ambulatory Visit: Payer: Self-pay | Admitting: Family

## 2019-10-17 ENCOUNTER — Other Ambulatory Visit: Payer: Self-pay | Admitting: Family

## 2019-10-19 DIAGNOSIS — Z1231 Encounter for screening mammogram for malignant neoplasm of breast: Secondary | ICD-10-CM | POA: Diagnosis not present

## 2019-12-29 DIAGNOSIS — Z23 Encounter for immunization: Secondary | ICD-10-CM | POA: Diagnosis not present

## 2020-03-09 ENCOUNTER — Other Ambulatory Visit: Payer: Self-pay | Admitting: Family

## 2020-07-26 DIAGNOSIS — L03011 Cellulitis of right finger: Secondary | ICD-10-CM | POA: Diagnosis not present

## 2020-07-26 DIAGNOSIS — M79644 Pain in right finger(s): Secondary | ICD-10-CM | POA: Diagnosis not present

## 2020-08-06 ENCOUNTER — Other Ambulatory Visit: Payer: Self-pay | Admitting: Family

## 2020-08-06 NOTE — Telephone Encounter (Signed)
Pt has not been seen in over a year. We have tried to call with no success in reaching pt.

## 2020-08-28 DIAGNOSIS — H35373 Puckering of macula, bilateral: Secondary | ICD-10-CM | POA: Diagnosis not present

## 2020-08-28 DIAGNOSIS — H52203 Unspecified astigmatism, bilateral: Secondary | ICD-10-CM | POA: Diagnosis not present

## 2020-08-28 DIAGNOSIS — H26493 Other secondary cataract, bilateral: Secondary | ICD-10-CM | POA: Diagnosis not present

## 2020-09-05 DIAGNOSIS — F331 Major depressive disorder, recurrent, moderate: Secondary | ICD-10-CM | POA: Diagnosis not present

## 2020-09-13 DIAGNOSIS — H26491 Other secondary cataract, right eye: Secondary | ICD-10-CM | POA: Diagnosis not present

## 2020-09-13 DIAGNOSIS — H26492 Other secondary cataract, left eye: Secondary | ICD-10-CM | POA: Diagnosis not present

## 2020-09-14 DIAGNOSIS — K623 Rectal prolapse: Secondary | ICD-10-CM | POA: Diagnosis not present

## 2020-09-14 DIAGNOSIS — L678 Other hair color and hair shaft abnormalities: Secondary | ICD-10-CM | POA: Diagnosis not present

## 2020-09-14 DIAGNOSIS — Z0000 Encounter for general adult medical examination without abnormal findings: Secondary | ICD-10-CM | POA: Diagnosis not present

## 2020-09-14 DIAGNOSIS — N6002 Solitary cyst of left breast: Secondary | ICD-10-CM | POA: Diagnosis not present

## 2020-09-14 DIAGNOSIS — F322 Major depressive disorder, single episode, severe without psychotic features: Secondary | ICD-10-CM | POA: Diagnosis not present

## 2020-09-14 DIAGNOSIS — K5730 Diverticulosis of large intestine without perforation or abscess without bleeding: Secondary | ICD-10-CM | POA: Diagnosis not present

## 2020-09-14 DIAGNOSIS — F411 Generalized anxiety disorder: Secondary | ICD-10-CM | POA: Diagnosis not present

## 2020-09-14 DIAGNOSIS — Z86010 Personal history of colonic polyps: Secondary | ICD-10-CM | POA: Diagnosis not present

## 2020-09-14 DIAGNOSIS — D175 Benign lipomatous neoplasm of intra-abdominal organs: Secondary | ICD-10-CM | POA: Diagnosis not present

## 2020-09-14 DIAGNOSIS — K635 Polyp of colon: Secondary | ICD-10-CM | POA: Diagnosis not present

## 2020-10-21 DIAGNOSIS — Z1152 Encounter for screening for COVID-19: Secondary | ICD-10-CM | POA: Diagnosis not present

## 2020-11-24 IMAGING — CT CT ABD-PELV W/ CM
2 of 6 series · 16 of 46 positions shown, 18 images · IV contrast (omnipaque)
Comparison: None.

CLINICAL DATA: Rectal bleeding

EXAM:
CT ABDOMEN AND PELVIS WITH CONTRAST
TECHNIQUE: Multidetector CT imaging of the abdomen and pelvis was performed
using the standard protocol following bolus administration of
intravenous contrast.
CONTRAST:  100mL OMNIPAQUE IOHEXOL 300 MG/ML  SOLN

[Series 3: axial st · axial · 0.70mm/px · z∈[-549,-154]mm · 13 of 91 slices shown, 15 images]
[im 6/91  soft-tissue]
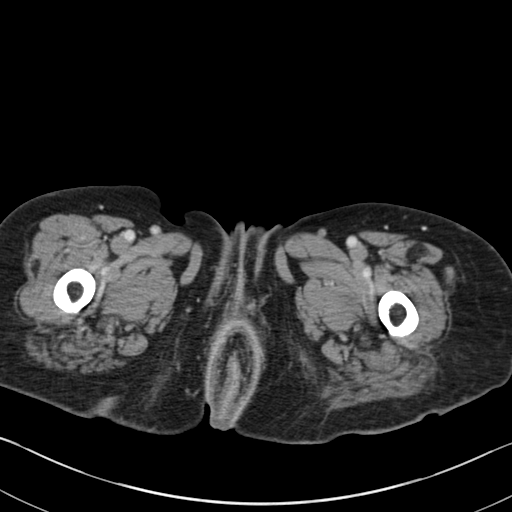
[im 6/91  bone]
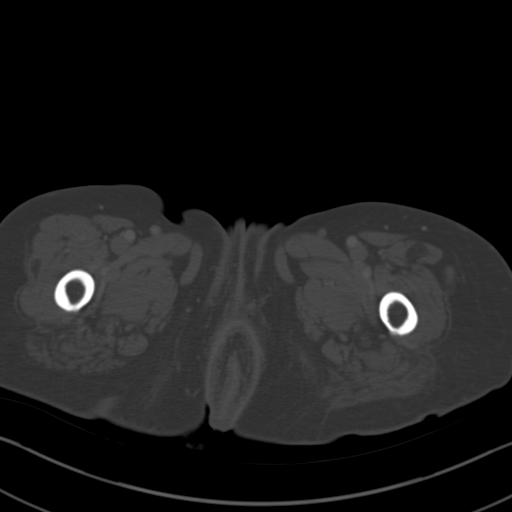
[im 12/91  soft-tissue]
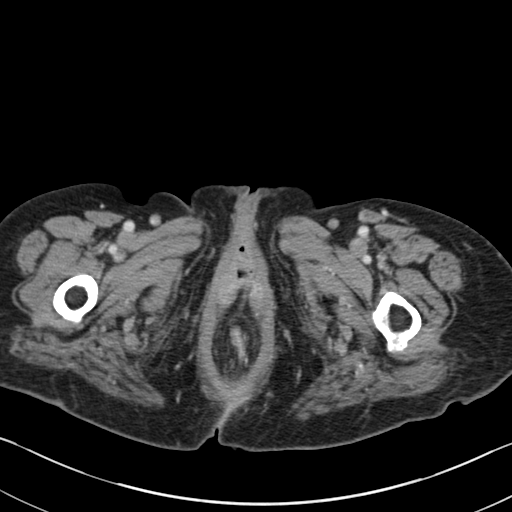
[im 17/91  soft-tissue]
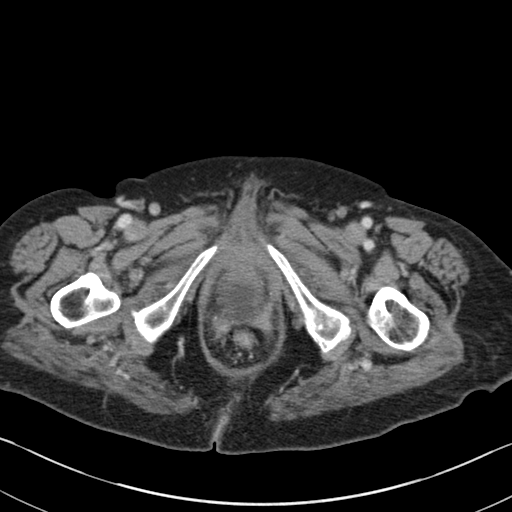
[im 29/91  soft-tissue]
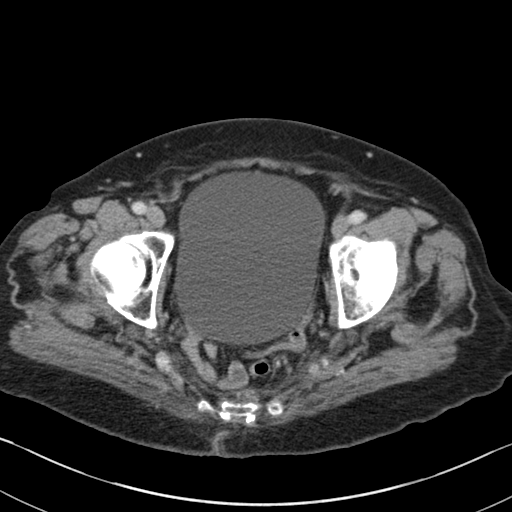
[im 34/91  soft-tissue]
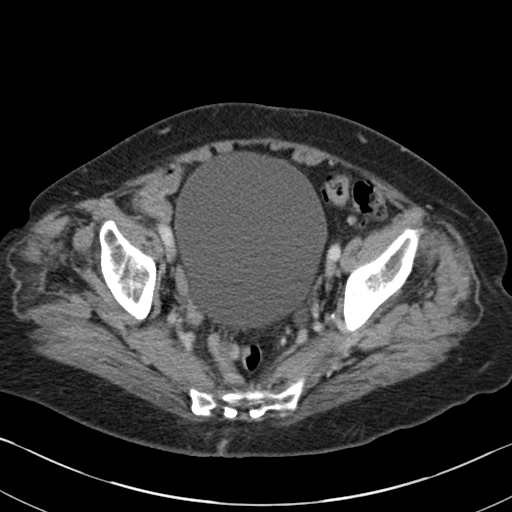
[im 40/91  soft-tissue]
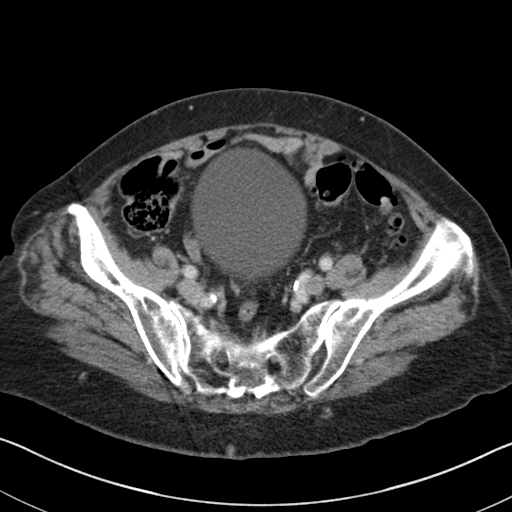
[im 46/91  soft-tissue]
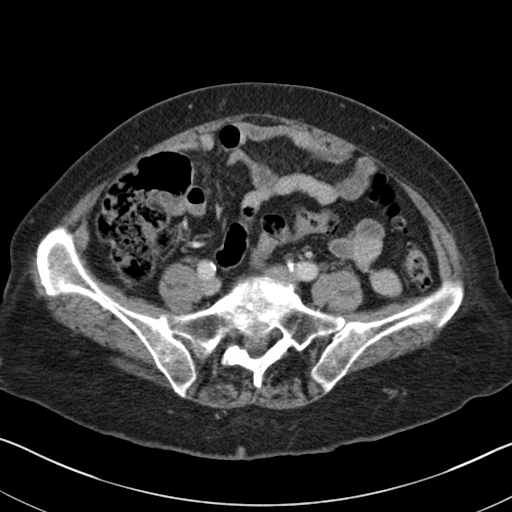
[im 51/91  soft-tissue]
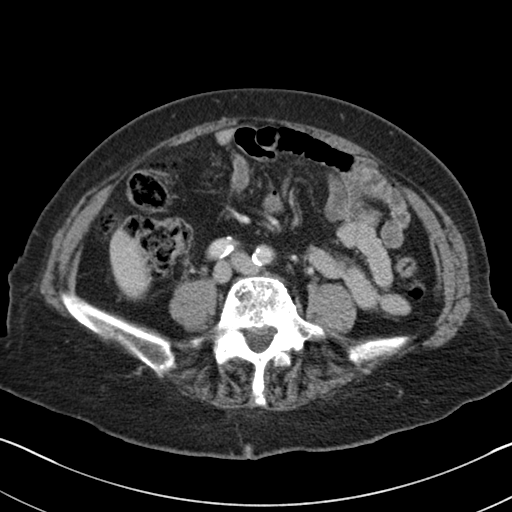
[im 57/91  soft-tissue]
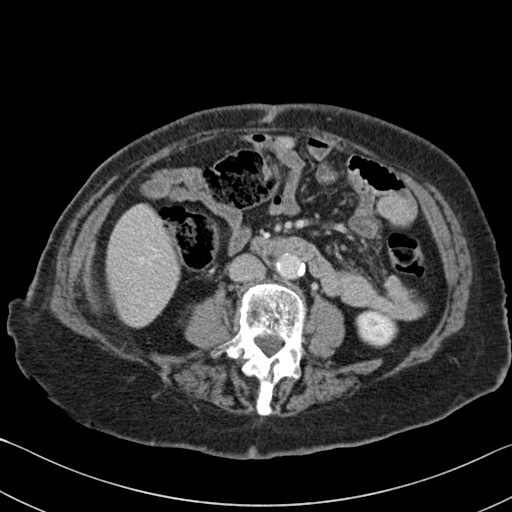
[im 57/91  bone]
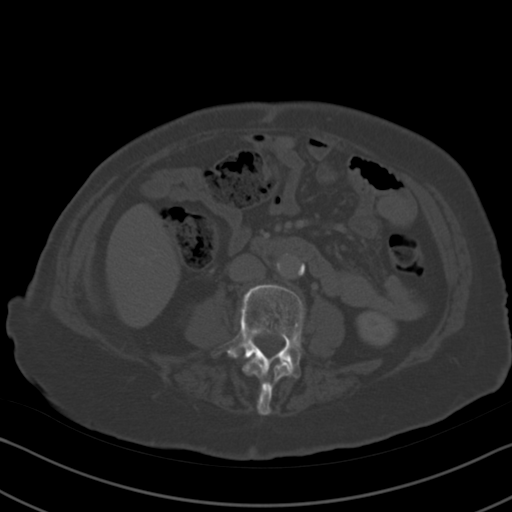
[im 62/91  soft-tissue]
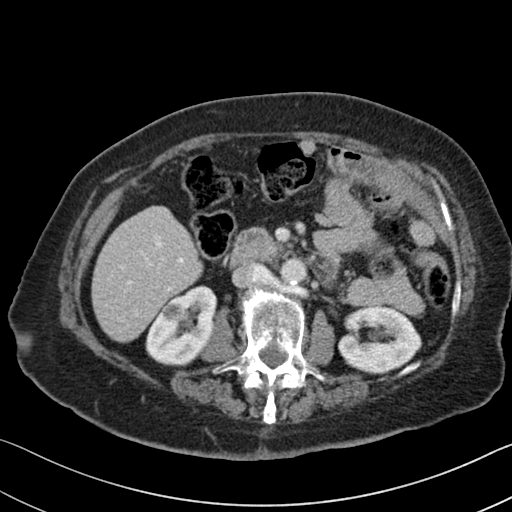
[im 74/91  soft-tissue]
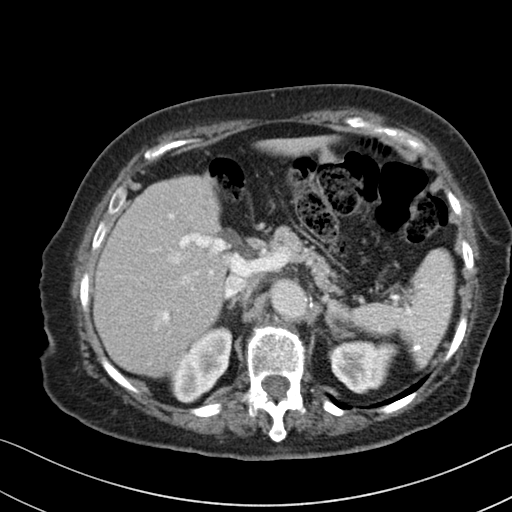
[im 79/91  soft-tissue]
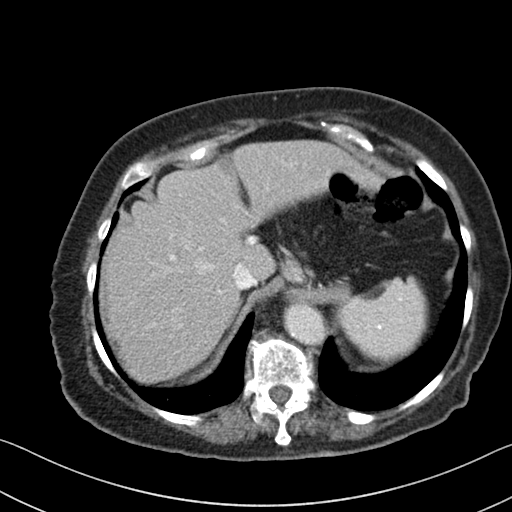
[im 85/91  soft-tissue]
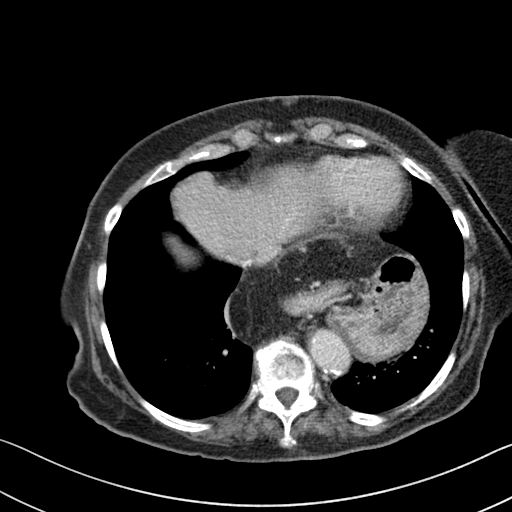

[Series 5: coronal st · coronal · 0.68mm/px · 3 of 122 slices shown]
[im 41/122  soft-tissue]
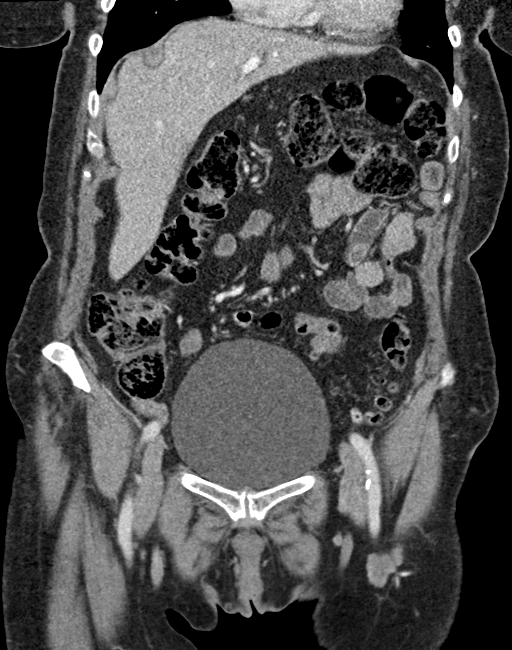
[im 54/122  soft-tissue]
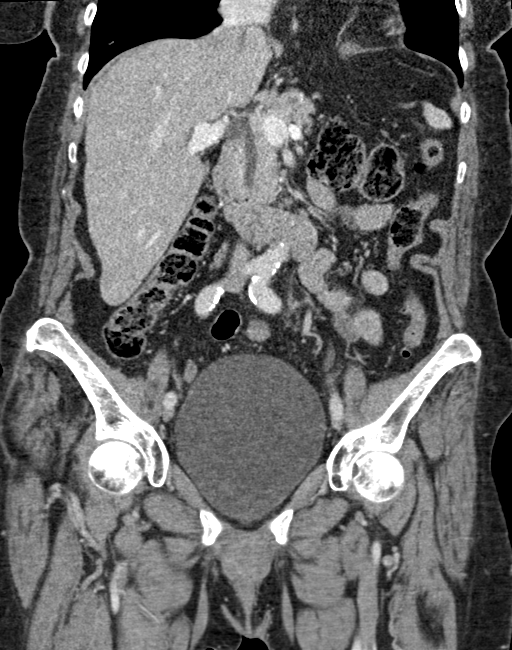
[im 68/122  soft-tissue]
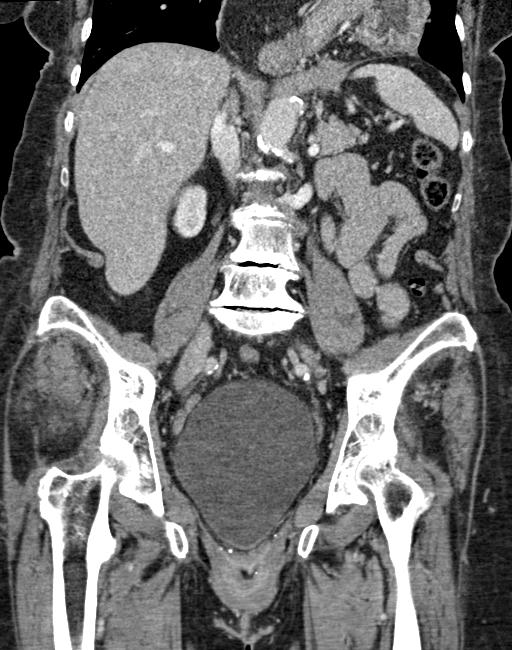

[16 of 46 positions shown; findings below may reference images not displayed]

FINDINGS: LOWER CHEST: Normal.

HEPATOBILIARY: Normal hepatic contours. No intra- or extrahepatic
biliary dilatation. Status post cholecystectomy.

PANCREAS: Normal pancreas. No ductal dilatation or peripancreatic
fluid collection.

SPLEEN: Normal.

ADRENALS/URINARY TRACT: The adrenal glands are normal. No
hydronephrosis, nephroureterolithiasis or solid renal mass. The
urinary bladder is normal for degree of distention

STOMACH/BOWEL: Small hiatal hernia. Normal course and caliber of the
duodenum. No small bowel dilatation or inflammation. Rectosigmoid
diverticulosis without acute inflammation. The appendix is not
visualized. There is a rectal prolapse.

VASCULAR/LYMPHATIC: There is calcific atherosclerosis of the
abdominal aorta. No abdominal or pelvic lymphadenopathy.

REPRODUCTIVE: Status post hysterectomy. No adnexal mass.

MUSCULOSKELETAL. Multilevel degenerative disc disease and facet
arthrosis. No bony spinal canal stenosis.

OTHER: None.
IMPRESSION: 1. Rectal prolapse.
2. No acute abnormality of the abdomen or pelvis.
3. Rectosigmoid diverticulosis without acute inflammation.
4. Aortic Atherosclerosis ([id number]).

## 2021-01-07 DIAGNOSIS — Z23 Encounter for immunization: Secondary | ICD-10-CM | POA: Diagnosis not present

## 2021-08-26 ENCOUNTER — Other Ambulatory Visit: Payer: Self-pay | Admitting: Obstetrics & Gynecology

## 2021-08-26 DIAGNOSIS — Z1231 Encounter for screening mammogram for malignant neoplasm of breast: Secondary | ICD-10-CM

## 2021-09-09 ENCOUNTER — Ambulatory Visit: Payer: Medicare Other

## 2021-09-18 ENCOUNTER — Ambulatory Visit
Admission: RE | Admit: 2021-09-18 | Discharge: 2021-09-18 | Disposition: A | Discharge status: Home / Self Care | Payer: Medicare Other | Source: Ambulatory Visit | Attending: Obstetrics & Gynecology | Admitting: Obstetrics & Gynecology

## 2021-09-18 DIAGNOSIS — Z1231 Encounter for screening mammogram for malignant neoplasm of breast: Secondary | ICD-10-CM

## 2021-10-23 ENCOUNTER — Other Ambulatory Visit: Payer: Self-pay | Admitting: Geriatric Medicine

## 2021-10-23 DIAGNOSIS — F0390 Unspecified dementia without behavioral disturbance: Secondary | ICD-10-CM

## 2021-10-30 ENCOUNTER — Other Ambulatory Visit: Payer: Self-pay | Admitting: Family

## 2021-10-30 ENCOUNTER — Other Ambulatory Visit: Payer: Self-pay | Admitting: Obstetrics & Gynecology

## 2021-11-12 ENCOUNTER — Ambulatory Visit
Admission: RE | Admit: 2021-11-12 | Discharge: 2021-11-12 | Disposition: A | Discharge status: Home / Self Care | Payer: Medicare Other | Source: Ambulatory Visit | Attending: Geriatric Medicine | Admitting: Geriatric Medicine

## 2021-11-12 DIAGNOSIS — F0390 Unspecified dementia without behavioral disturbance: Secondary | ICD-10-CM

## 2022-07-13 ENCOUNTER — Encounter (HOSPITAL_COMMUNITY): Payer: Self-pay

## 2022-07-13 ENCOUNTER — Inpatient Hospital Stay (HOSPITAL_COMMUNITY)
Admission: EM | Admit: 2022-07-13 | Discharge: 2022-07-15 | DRG: 918 | Disposition: A | Discharge status: Home / Self Care | Payer: Medicare Other | Attending: Family Medicine | Admitting: Family Medicine

## 2022-07-13 ENCOUNTER — Other Ambulatory Visit: Payer: Self-pay

## 2022-07-13 DIAGNOSIS — R251 Tremor, unspecified: Secondary | ICD-10-CM

## 2022-07-13 DIAGNOSIS — T63061A Toxic effect of venom of other North and South American snake, accidental (unintentional), initial encounter: Secondary | ICD-10-CM | POA: Diagnosis not present

## 2022-07-13 DIAGNOSIS — Z809 Family history of malignant neoplasm, unspecified: Secondary | ICD-10-CM

## 2022-07-13 DIAGNOSIS — Z8249 Family history of ischemic heart disease and other diseases of the circulatory system: Secondary | ICD-10-CM

## 2022-07-13 DIAGNOSIS — Z8673 Personal history of transient ischemic attack (TIA), and cerebral infarction without residual deficits: Secondary | ICD-10-CM

## 2022-07-13 DIAGNOSIS — Z7982 Long term (current) use of aspirin: Secondary | ICD-10-CM

## 2022-07-13 DIAGNOSIS — R4701 Aphasia: Secondary | ICD-10-CM | POA: Diagnosis present

## 2022-07-13 DIAGNOSIS — Z79899 Other long term (current) drug therapy: Secondary | ICD-10-CM

## 2022-07-13 DIAGNOSIS — Z808 Family history of malignant neoplasm of other organs or systems: Secondary | ICD-10-CM

## 2022-07-13 DIAGNOSIS — Z833 Family history of diabetes mellitus: Secondary | ICD-10-CM

## 2022-07-13 DIAGNOSIS — W5911XA Bitten by nonvenomous snake, initial encounter: Principal | ICD-10-CM | POA: Diagnosis present

## 2022-07-13 DIAGNOSIS — R471 Dysarthria and anarthria: Secondary | ICD-10-CM

## 2022-07-13 DIAGNOSIS — F419 Anxiety disorder, unspecified: Secondary | ICD-10-CM | POA: Diagnosis present

## 2022-07-13 DIAGNOSIS — E785 Hyperlipidemia, unspecified: Secondary | ICD-10-CM | POA: Diagnosis present

## 2022-07-13 DIAGNOSIS — Z23 Encounter for immunization: Secondary | ICD-10-CM

## 2022-07-13 DIAGNOSIS — M810 Age-related osteoporosis without current pathological fracture: Secondary | ICD-10-CM | POA: Diagnosis present

## 2022-07-13 DIAGNOSIS — Z803 Family history of malignant neoplasm of breast: Secondary | ICD-10-CM

## 2022-07-13 DIAGNOSIS — F32A Depression, unspecified: Secondary | ICD-10-CM | POA: Diagnosis not present

## 2022-07-13 DIAGNOSIS — Y92007 Garden or yard of unspecified non-institutional (private) residence as the place of occurrence of the external cause: Secondary | ICD-10-CM

## 2022-07-13 LAB — CBC WITH DIFFERENTIAL/PLATELET
Abs Immature Granulocytes: 0.01 10*3/uL (ref 0.00–0.07)
Abs Immature Granulocytes: 0.01 10*3/uL (ref 0.00–0.07)
Basophils Absolute: 0.1 10*3/uL (ref 0.0–0.1)
Basophils Absolute: 0.1 10*3/uL (ref 0.0–0.1)
Basophils Relative: 1 %
Basophils Relative: 1 %
Eosinophils Absolute: 0.2 10*3/uL (ref 0.0–0.5)
Eosinophils Absolute: 0.1 10*3/uL (ref 0.0–0.5)
Eosinophils Relative: 3 %
Eosinophils Relative: 2 %
HCT: 39.3 % (ref 36.0–46.0)
HCT: 38.3 % (ref 36.0–46.0)
Hemoglobin: 13 g/dL (ref 12.0–15.0)
Hemoglobin: 12.4 g/dL (ref 12.0–15.0)
Immature Granulocytes: 0 %
Immature Granulocytes: 0 %
Lymphocytes Relative: 34 %
Lymphocytes Relative: 22 %
Lymphs Abs: 2.1 10*3/uL (ref 0.7–4.0)
Lymphs Abs: 1.7 10*3/uL (ref 0.7–4.0)
MCH: 30.7 pg (ref 26.0–34.0)
MCH: 30.6 pg (ref 26.0–34.0)
MCHC: 33.1 g/dL (ref 30.0–36.0)
MCHC: 32.4 g/dL (ref 30.0–36.0)
MCV: 92.9 fL (ref 80.0–100.0)
MCV: 94.6 fL (ref 80.0–100.0)
Monocytes Absolute: 0.5 10*3/uL (ref 0.1–1.0)
Monocytes Absolute: 0.6 10*3/uL (ref 0.1–1.0)
Monocytes Relative: 9 %
Monocytes Relative: 7 %
Neutro Abs: 3.2 10*3/uL (ref 1.7–7.7)
Neutro Abs: 5.4 10*3/uL (ref 1.7–7.7)
Neutrophils Relative %: 53 %
Neutrophils Relative %: 68 %
Platelets: 292 10*3/uL (ref 150–400)
Platelets: 280 10*3/uL (ref 150–400)
RBC: 4.23 MIL/uL (ref 3.87–5.11)
RBC: 4.05 MIL/uL (ref 3.87–5.11)
RDW: 13.3 % (ref 11.5–15.5)
RDW: 13.2 % (ref 11.5–15.5)
WBC: 6.1 10*3/uL (ref 4.0–10.5)
WBC: 7.8 10*3/uL (ref 4.0–10.5)
nRBC: 0 % (ref 0.0–0.2)
nRBC: 0 % (ref 0.0–0.2)

## 2022-07-13 LAB — FIBRINOGEN
Fibrinogen: 324 mg/dL (ref 210–475)
Fibrinogen: 157 mg/dL — ABNORMAL LOW (ref 210–475)

## 2022-07-13 LAB — PROTIME-INR
INR: 1 (ref 0.8–1.2)
INR: 1.5 — ABNORMAL HIGH (ref 0.8–1.2)
Prothrombin Time: 13.3 seconds (ref 11.4–15.2)
Prothrombin Time: 18.2 seconds — ABNORMAL HIGH (ref 11.4–15.2)

## 2022-07-13 LAB — APTT: aPTT: 27 seconds (ref 24–36)

## 2022-07-13 LAB — CK: Total CK: 99 U/L (ref 38–234)

## 2022-07-13 MED ORDER — TETANUS-DIPHTH-ACELL PERTUSSIS 5-2.5-18.5 LF-MCG/0.5 IM SUSY
0.5000 mL | PREFILLED_SYRINGE | Freq: Once | INTRAMUSCULAR | Status: AC
  Administered 2022-07-13: 0.5 mL via INTRAMUSCULAR
  Filled 2022-07-13: qty 0.5

## 2022-07-13 MED ORDER — HYDROMORPHONE HCL 1 MG/ML IJ SOLN
0.5000 mg | INTRAMUSCULAR | Status: DC | PRN
  Administered 2022-07-13: 0.5 mg via INTRAVENOUS
  Filled 2022-07-13: qty 1

## 2022-07-13 MED ORDER — ACETAMINOPHEN 325 MG PO TABS: 650.0000 mg | ORAL_TABLET | Freq: Four times a day (QID) | ORAL | Status: DC | PRN

## 2022-07-13 MED ORDER — HYDROMORPHONE HCL 1 MG/ML IJ SOLN: 0.5000 mg | Freq: Once | INTRAMUSCULAR | Status: DC

## 2022-07-13 MED ORDER — SODIUM CHLORIDE 0.9 % IV BOLUS
20.0000 mL/kg | Freq: Once | INTRAVENOUS | Status: AC
  Administered 2022-07-13: 1360 mL via INTRAVENOUS

## 2022-07-13 MED ORDER — SODIUM CHLORIDE 0.9 % IV SOLN
4.0000 | Freq: Once | INTRAVENOUS | Status: AC
  Administered 2022-07-13: 4 via INTRAVENOUS
  Filled 2022-07-13: qty 72

## 2022-07-13 MED ORDER — BUSPIRONE HCL 5 MG PO TABS
15.0000 mg | ORAL_TABLET | Freq: Two times a day (BID) | ORAL | Status: DC
  Administered 2022-07-13 – 2022-07-14 (×3): 15 mg via ORAL
  Filled 2022-07-13 (×3): qty 1

## 2022-07-13 MED ORDER — ENOXAPARIN SODIUM 40 MG/0.4ML IJ SOSY
40.0000 mg | PREFILLED_SYRINGE | INTRAMUSCULAR | Status: DC
  Administered 2022-07-13 – 2022-07-14 (×2): 40 mg via SUBCUTANEOUS
  Filled 2022-07-13 (×2): qty 0.4

## 2022-07-13 MED ORDER — SODIUM CHLORIDE 0.9 % IV SOLN: INTRAVENOUS | Status: DC

## 2022-07-13 MED ORDER — FLUOXETINE HCL 40 MG PO CAPS: 40.0000 mg | ORAL_CAPSULE | Freq: Every day | ORAL | Status: DC

## 2022-07-13 MED ORDER — HYDROMORPHONE HCL 1 MG/ML IJ SOLN
0.5000 mg | INTRAMUSCULAR | Status: DC | PRN
  Administered 2022-07-13: 0.5 mg via INTRAVENOUS
  Filled 2022-07-13: qty 0.5

## 2022-07-13 MED ORDER — FLUOXETINE HCL 20 MG PO CAPS
40.0000 mg | ORAL_CAPSULE | Freq: Every day | ORAL | Status: DC
  Administered 2022-07-14: 40 mg via ORAL
  Filled 2022-07-13: qty 2

## 2022-07-13 MED ORDER — MORPHINE SULFATE (PF) 4 MG/ML IV SOLN
4.0000 mg | Freq: Once | INTRAVENOUS | Status: AC
  Administered 2022-07-13: 4 mg via INTRAVENOUS
  Filled 2022-07-13: qty 1

## 2022-07-13 MED ORDER — HYDROCODONE-ACETAMINOPHEN 5-325 MG PO TABS
1.0000 | ORAL_TABLET | Freq: Four times a day (QID) | ORAL | Status: DC | PRN
  Administered 2022-07-13: 1 via ORAL
  Filled 2022-07-13: qty 1

## 2022-07-13 NOTE — H&P (Signed)
History and Physical    Patient: Brittney Miller ZOX:[id number] DOB: 10/07/1940 DOA: 07/13/2022 DOS: the patient was seen and examined on 07/13/2022 PCP: Olive Bass, FNP  Patient coming from: Home  Chief Complaint:  Chief Complaint  Patient presents with   Snake Bite   HPI: Brittney Miller is a 82 y.o. female with medical history significant of osteoporosis, depression, HLD who presents with snake bite to right hand.   Patient was gardening and reaching under some leaves when a snake bite her to the dorsal side of the 3rd digit close to her MCP. She had immediate pain and edema and presented to ED. Did not attempt to self-treat at home. No other symptoms of headache, nausea, or vomiting.  She was noted to have resting tremors and dyarthria on exam and daughter says this has been present for at least 8 years. Did not think pt has seen a doctor routinely for at least 3 years although she is being prescribed anti-depressants by someone.    In the ED, vitals are stable with normal BP.  CBC unremarkable. PT/INR and Fibrinogen are within normal limits.  Review of Systems: As mentioned in the history of present illness. All other systems reviewed and are negative. Past Medical History:  Diagnosis Date   Anxiety    Arthritis    Cataract    Chronic kidney disease    Dyslipidemia    Past Surgical History:  Procedure Laterality Date   Cataract surgery     CHOLECYSTECTOMY     COLONOSCOPY     TONSILLECTOMY     XI ROBOT ASSISTED RECTOPEXY N/A 04/15/2019   Procedure: XI ROBOT ASSISTED RECTOPEXY;  Surgeon: Romie Levee, MD;  Location: [facility];  Service: General;  Laterality: N/A;   Social History:  reports that she has never smoked. She has never used smokeless tobacco. She reports current alcohol use. She reports that she does not use drugs.  No Known Allergies  Family History  Problem Relation Age of Onset   Hypertension Mother    Diabetes Father    Breast  cancer Maternal Grandmother    Cancer Daughter        With brain metastasis   Brain cancer Maternal Uncle        Unsure if brain mets or primary neurologic trauma   Colon cancer Neg Hx    Esophageal cancer Neg Hx    Pancreatic cancer Neg Hx    Stomach cancer Neg Hx     Prior to Admission medications   Medication Sig Start Date End Date Taking? Authorizing Provider  busPIRone (BUSPAR) 15 MG tablet Take 15 mg by mouth 2 (two) times daily.   Yes [provider]  FLUoxetine (PROZAC) 40 MG capsule Take 40 mg by mouth daily.  03/25/18  Yes [provider]  furosemide (LASIX) 20 MG tablet TAKE 1 TABLET BY MOUTH EVERY DAY AS NEEDED Patient not taking: Reported on 07/13/2022 07/26/19   Olive Bass, FNP    Physical Exam: Vitals:   07/13/22 1830 07/13/22 1900 07/13/22 1930 07/13/22 1951  BP: (!) 138/99 (!) 140/104 (!) 150/76 (!) 149/71  Pulse: 86 83 63 73  Resp: 18 19  20   Temp:    98.5 F (36.9 C)  TempSrc:    Oral  SpO2: 97% 97% 98% 99%  Weight:      Height:       Constitutional: NAD, calm, comfortable, elderly female sitting upright in bed Eyes:  lids and conjunctivae  normal ENMT: Mucous membranes are moist.  Neck: normal, supple,  Respiratory: clear to auscultation bilaterally, no wheezing, no crackles. Normal respiratory effort. No accessory muscle use.  Cardiovascular: Regular rate and rhythm, no murmurs / rubs / gallops. No extremity edema.  Abdomen: no tenderness, soft Musculoskeletal: no clubbing / cyanosis. No joint deformity upper and lower extremities. Circumferential edema of the right hand with edema and spreading erythema up mid-forearm. +2 right radial pulse. +2 good capillary refill. Minimal tenderness with palpation. Skin: see MSK exam above Neurologic: CN 2-12 grossly intact. Sensation intact, Resting tremors of the upper extremities. Dysarthria and appears to have difficulty word finding.   Psychiatric:  Alert and oriented x 3. Normal mood.    Data Reviewed:  See HPI  Assessment and Plan: * Snake bites, initial encounter -copperhead snake bite to near MCP of RIGHT hand around 5pm with spreading erythema and edema up to mid-forearm    -has received Tetanus immunization -received 4 vials of CroFab at 1919 ;  monitor erythema/edema hourly after administration until symptoms stop advancing, then can continue monitoring and measure every 2 hours  -continue to elevate extremity above the heart in relative extension -repeat PT/INR, fibrogen in 6 hours after bite -Do NOT use ice, tourniquet, prophylactic antibiotics, or steroids   Dysarthria Resting upper extremity tremors -per daughter these symptoms have been present for 8 years. Advised that she receives outpatient neurology evaluation. Could be residual deficit from remote stroke or could be progressing neurological disease.   Depression -continue buspirone and fluoxetine       Advance Care Planning: Full  Consults: ED PA has discussed and open case with Poison control  Family Communication: daughter at bedside  Severity of Illness: The appropriate patient status for this patient is OBSERVATION. Observation status is judged to be reasonable and necessary in order to provide the required intensity of service to ensure the patient's safety. The patient's presenting symptoms, physical exam findings, and initial radiographic and laboratory data in the context of their medical condition is felt to place them at decreased risk for further clinical deterioration. Furthermore, it is anticipated that the patient will be medically stable for discharge from the hospital within 2 midnights of admission.   Author: Anselm Jungling, DO 07/13/2022 8:36 PM  For on call review [email or web address].

## 2022-07-13 NOTE — Progress Notes (Addendum)
Measurement for monitoring of snake bite: right hand and arm at 2035 Hand= 19 cm Wrist = 17 cm Forearm = 18cm Bicep = 21cm Axillary = no tenderness.

## 2022-07-13 NOTE — Assessment & Plan Note (Signed)
Resting upper extremity tremors -per daughter these symptoms have been present for 8 years. Advised that she receives outpatient neurology evaluation. Could be residual deficit from remote stroke or could be progressing neurological disease.

## 2022-07-13 NOTE — ED Provider Notes (Signed)
[place] EMERGENCY DEPARTMENT AT [facility] Provider Note   CSN: [id number] Arrival date & time: 07/13/22  1640     History {Add pertinent medical, surgical, social history, OB history to HPI:1} Chief Complaint  Patient presents with  . Snake Bite    Brittney Miller is a 82 y.o. female.   Animal Bite Contact animal:  Snake Location:  Hand Hand injury location:  R hand Pain details:    Quality:  Aching and burning   Severity:  Severe   Timing:  Constant   Progression:  Worsening Incident location:  Home Provoked: provoked   Notifications:  None      Home Medications Prior to Admission medications   Medication Sig Start Date End Date Taking? Authorizing Provider  acetaminophen (TYLENOL) 500 MG tablet Take 500 mg by mouth every 6 (six) hours as needed for mild pain.    [provider]  aspirin EC 81 MG tablet Take 81 mg by mouth daily.    [provider]  FLUoxetine (PROZAC) 40 MG capsule Take 40 mg by mouth daily.  03/25/18   [provider]  furosemide (LASIX) 20 MG tablet TAKE 1 TABLET BY MOUTH EVERY DAY AS NEEDED 07/26/19   Olive Bass, FNP  MAGNESIUM PO Take 1 tablet by mouth 2 (two) times a week.     [provider]  Multiple Vitamin (MULTIVITAMIN) tablet Take 1 tablet by mouth daily.    [provider]      Allergies    Patient has no known allergies.    Review of Systems   Review of Systems  Physical Exam Updated Vital Signs BP (!) 177/80 (BP Location: Left Arm)   Pulse 82   Temp 98.7 F (37.1 C) (Oral)   Resp 20   Ht 4\' 10"  (1.473 m)   Wt 68 kg   LMP  (LMP Unknown)   BMI 31.35 kg/m  Physical Exam  ED Results / Procedures / Treatments   Labs (all labs ordered are listed, but only abnormal results are displayed) Labs Reviewed  CBC WITH DIFFERENTIAL/PLATELET  CBC WITH DIFFERENTIAL/PLATELET  CBC WITH DIFFERENTIAL/PLATELET  PROTIME-INR  PROTIME-INR  PROTIME-INR   FIBRINOGEN  FIBRINOGEN  FIBRINOGEN  APTT  CK    EKG None  Radiology No results found.  Procedures Procedures  {Document cardiac monitor, telemetry assessment procedure when appropriate:1}  Medications Ordered in ED Medications  0.9 %  sodium chloride infusion (has no administration in time range)  morphine (PF) 4 MG/ML injection 4 mg (has no administration in time range)  Tdap (BOOSTRIX) injection 0.5 mL (has no administration in time range)    ED Course/ Medical Decision Making/ A&P Clinical Course as of 07/13/22 1849  Sun Jul 13, 2022  1718 Spoke with Danelle Earthly with at poison control Obs time 6 hours- repeat coag Algorithms sent   [AH]  1815 Patient with rapid progression of swelling. I spoke with Poison control- agree with admin of CROFAB- 4 vials.  [AH]    Clinical Course User Index [AH] Arthor Captain, PA-C   {   Click here for ABCD2, HEART and other calculatorsREFRESH Note before signing :1}                          Medical Decision Making Amount and/or Complexity of Data Reviewed Labs: ordered.  Risk Prescription drug management.   ***  {Document critical care time when appropriate:1} {Document review of labs and clinical  decision tools ie heart score, Chads2Vasc2 etc:1}  {Document your independent review of radiology images, and any outside records:1} {Document your discussion with family members, caretakers, and with consultants:1} {Document social determinants of health affecting pt's care:1} {Document your decision making why or why not admission, treatments were needed:1} Final Clinical Impression(s) / ED Diagnoses Final diagnoses:  None    Rx / DC Orders ED Discharge Orders     None

## 2022-07-13 NOTE — Progress Notes (Signed)
Patient is transferred from ED to the unit at 0745. Alert and oriented x 4. Pain is complained on the R arm. Medication for pain PRN was given in ED. Vital signs was taken and room is set up. Call light was within patient's reach. Waiting for new orders.

## 2022-07-13 NOTE — ED Triage Notes (Signed)
Pt arrived via POV. C/o snake bite to R hand at 1600. Hand is swollen and painful. Swelling sreaping to inner aspect of wrist. Pt reports snake was small and brown.  Pt extremely anxious.

## 2022-07-13 NOTE — ED Notes (Addendum)
ED TO INPATIENT HANDOFF REPORT  ED Nurse Name and Phone #: Darnell Level Name/Age/Gender Brittney Miller Area 82 y.o. female Room/Bed: WA13/WA13  Code Status   Code Status: Prior  Home/SNF/Other Home Patient oriented to: self, place, time, and situation Is this baseline? Yes   Triage Complete: Triage complete  Chief Complaint Snake bites, initial encounter [W59.11XA]  Triage Note Pt arrived via POV. C/o snake bite to R hand at 1600. Hand is swollen and painful. Swelling sreaping to inner aspect of wrist. Pt reports snake was small and brown.  Pt extremely anxious.   Allergies No Known Allergies  Level of Care/Admitting Diagnosis ED Disposition     ED Disposition  Admit   Condition  --   Comment  Hospital Area: [facility] [[id number]]  Level of Care: Med-Surg [16]  May place patient in observation at [facility] or [facility] if equivalent level of care is available:: No  Covid Evaluation: Asymptomatic - no recent exposure (last 10 days) testing not required  Diagnosis: Snake bites, initial encounter [id number]  Admitting Physician: Anselm Jungling [[id number]]  Attending Physician: Anselm Jungling [[id number]]          B Medical/Surgery History Past Medical History:  Diagnosis Date   Anxiety    Arthritis    Cataract    Chronic kidney disease    Dyslipidemia    Past Surgical History:  Procedure Laterality Date   Cataract surgery     CHOLECYSTECTOMY     COLONOSCOPY     TONSILLECTOMY     XI ROBOT ASSISTED RECTOPEXY N/A 04/15/2019   Procedure: XI ROBOT ASSISTED RECTOPEXY;  Surgeon: Romie Levee, MD;  Location: [facility];  Service: General;  Laterality: N/A;     A IV Location/Drains/Wounds Patient Lines/Drains/Airways Status     Active Line/Drains/Airways     Name Placement date Placement time Site Days   Peripheral IV 07/13/22 20 G Left;Posterior Hand 07/13/22  1707  Hand  less than 1   Incision (Closed) 04/15/19 Abdomen Other (Comment) 04/15/19   0909  -- 1185   Incision - 5 Ports Abdomen Left;Upper Right;Upper Right;Lateral Right;Mid Right;Lower 04/15/19  0825  -- 1185            Intake/Output Last 24 hours No intake or output data in the 24 hours ending 07/13/22 1901  Labs/Imaging Results for orders placed or performed during the hospital encounter of 07/13/22 (from the past 48 hour(s))  CBC WITH DIFFERENTIAL     Status: None   Collection Time: 07/13/22  5:01 PM  Result Value Ref Range   WBC 6.1 4.0 - 10.5 K/uL   RBC 4.23 3.87 - 5.11 MIL/uL   Hemoglobin 13.0 12.0 - 15.0 g/dL   HCT 69.6 29.5 - 28.4 %   MCV 92.9 80.0 - 100.0 fL   MCH 30.7 26.0 - 34.0 pg   MCHC 33.1 30.0 - 36.0 g/dL   RDW 13.2 44.0 - 10.2 %   Platelets 292 150 - 400 K/uL   nRBC 0.0 0.0 - 0.2 %   Neutrophils Relative % 53 %   Neutro Abs 3.2 1.7 - 7.7 K/uL   Lymphocytes Relative 34 %   Lymphs Abs 2.1 0.7 - 4.0 K/uL   Monocytes Relative 9 %   Monocytes Absolute 0.5 0.1 - 1.0 K/uL   Eosinophils Relative 3 %   Eosinophils Absolute 0.2 0.0 - 0.5 K/uL   Basophils Relative 1 %   Basophils Absolute 0.1 0.0 - 0.1 K/uL  Immature Granulocytes 0 %   Abs Immature Granulocytes 0.01 0.00 - 0.07 K/uL    Comment: Performed at [facility], [address] [place], [place] [address]  Protime-INR     Status: None   Collection Time: 07/13/22  5:01 PM  Result Value Ref Range   Prothrombin Time 13.3 11.4 - 15.2 seconds   INR 1.0 0.8 - 1.2    Comment: (NOTE) INR goal varies based on device and disease states. Performed at [facility], [address], [place], [place] [address]   Fibrinogen     Status: None   Collection Time: 07/13/22  5:01 PM  Result Value Ref Range   Fibrinogen 324 210 - 475 mg/dL    Comment: (NOTE) Fibrinogen results may be underestimated in patients receiving thrombolytic therapy. Performed at [facility], [address] [place], [place] [address]   APTT     Status: None    Collection Time: 07/13/22  5:01 PM  Result Value Ref Range   aPTT 27 24 - 36 seconds    Comment: Performed at [facility], [address] [place], [place] [address]  CK     Status: None   Collection Time: 07/13/22  5:01 PM  Result Value Ref Range   Total CK 99 38 - 234 U/L    Comment: Performed at [facility], [address], [place], [place] [address]   No results found.  Pending Labs Unresulted Labs (From admission, onward)     Start     Ordered   07/13/22 1659  CBC WITH DIFFERENTIAL  Now then every 4 hours,   R (with STAT occurrences)      07/13/22 1700   07/13/22 1659  Protime-INR  Now then every 4 hours,   R (with STAT occurrences)      07/13/22 1700   07/13/22 1659  Fibrinogen  Now then every 4 hours,   R (with STAT occurrences)      07/13/22 1700            Vitals/Pain Today's Vitals   07/13/22 1700 07/13/22 1739 07/13/22 1800 07/13/22 1830  BP: (!) 141/67  131/80 (!) 138/99  Pulse: 77  71 86  Resp: 19  20 18   Temp:      TempSrc:      SpO2: 97%  98% 97%  Weight:      Height:      PainSc:  4       Isolation Precautions No active isolations  Medications Medications  0.9 %  sodium chloride infusion ( Intravenous New Bag/Given 07/13/22 1713)  HYDROmorphone (DILAUDID) injection 0.5 mg (has no administration in time range)  crotalidae polyvalent immune fab (CROFAB) 4 vial in sodium chloride 0.9 % 250 mL infusion (has no administration in time range)  morphine (PF) 4 MG/ML injection 4 mg (4 mg Intravenous Given 07/13/22 1713)  Tdap (BOOSTRIX) injection 0.5 mL (0.5 mLs Intramuscular Given 07/13/22 1713)  sodium chloride 0.9 % bolus 1,360 mL (1,360 mLs Intravenous New Bag/Given 07/13/22 1823)    Mobility Walks with assistance     Focused Assessments skin   R Recommendations: See Admitting Provider Note  Report given to:   Additional Notes: n/a

## 2022-07-13 NOTE — ED Notes (Signed)
Swelling noted to have spread up to pt wrist within 30 minutes

## 2022-07-13 NOTE — Assessment & Plan Note (Signed)
-  continue buspirone and fluoxetine

## 2022-07-13 NOTE — ED Provider Notes (Signed)
I provided a substantive portion of the care of this patient.  I personally made/approved the management plan for this patient and take responsibility for the patient management.      CRITICAL CARE Performed by: Vanetta Mulders Total critical care time: 40 minutes Critical care time was exclusive of separately billable procedures and treating other patients. Critical care was necessary to treat or prevent imminent or life-threatening deterioration. Critical care was time spent personally by me on the following activities: development of treatment plan with patient and/or surrogate as well as nursing, discussions with consultants, evaluation of patient's response to treatment, examination of patient, obtaining history from patient or surrogate, ordering and performing treatments and interventions, ordering and review of laboratory studies, ordering and review of radiographic studies, pulse oximetry and re-evaluation of patient's condition.  Patient seen by me along with physician assistant.  Approximately around 1600 patient received a snake bite to her right hand.  Pain since hand is already swollen fingers are swollen and is moving into the proximal part of the forearm and it is painful consistent with a venomous snake bite.  Discussed with poison control they concurred with giving antivenom and 4 vials.  Patient is initial labs without significant abnormalities patient's initial vital signs very reassuring.  Patient's platelet count was normal at 292.  And 5 gentleman was 324.  Patient's PT was 13.2 with an INR of 1.0 and her PTT was 27 CK was 99.  Patient will receive some pain control.  We will observe her to make sure she does not have an allergic reaction to the initial doses of the antivenom and will require admission to stepdown by hospitalist service.  In addition patient is elderly there may be some component of dementia there could be some component of aphasia as she has some difficulty  expressing words.  Apparently this is not new.  But something to keep in mind patient may require workup for this which is separate from the snake bite to her right hand.  Appears that the initial bites were probably on the digits.  And maybe the distal dorsum of the hand.  Patient was in the garden and got bitten.     Vanetta Mulders, MD 07/13/22 Windell Moment

## 2022-07-13 NOTE — Assessment & Plan Note (Addendum)
-  copperhead snake bite to near MCP of RIGHT hand around 5pm with spreading erythema and edema up to mid-forearm    -has received Tetanus immunization -received 4 vials of CroFab at 1919 ;  monitor erythema/edema hourly after administration until symptoms stop advancing, then can continue monitoring and measure every 2 hours  -continue to elevate extremity above the heart in relative extension -repeat PT/INR, fibrogen in 6 hours after bite -Do NOT use ice, tourniquet, prophylactic antibiotics, or steroids

## 2022-07-13 NOTE — Assessment & Plan Note (Deleted)
Resting upper extremity tremors -per daughter these symptoms have been present for 8 years. Advised that she receives outpatient neurology evaluation. Could be residual deficit from remote stroke or could be progressing neurological disease.  

## 2022-07-14 DIAGNOSIS — Z803 Family history of malignant neoplasm of breast: Secondary | ICD-10-CM | POA: Diagnosis not present

## 2022-07-14 DIAGNOSIS — Y92007 Garden or yard of unspecified non-institutional (private) residence as the place of occurrence of the external cause: Secondary | ICD-10-CM | POA: Diagnosis not present

## 2022-07-14 DIAGNOSIS — F419 Anxiety disorder, unspecified: Secondary | ICD-10-CM | POA: Diagnosis present

## 2022-07-14 DIAGNOSIS — Z808 Family history of malignant neoplasm of other organs or systems: Secondary | ICD-10-CM | POA: Diagnosis not present

## 2022-07-14 DIAGNOSIS — T63061A Toxic effect of venom of other North and South American snake, accidental (unintentional), initial encounter: Secondary | ICD-10-CM | POA: Diagnosis present

## 2022-07-14 DIAGNOSIS — Z79899 Other long term (current) drug therapy: Secondary | ICD-10-CM | POA: Diagnosis not present

## 2022-07-14 DIAGNOSIS — R4701 Aphasia: Secondary | ICD-10-CM | POA: Diagnosis present

## 2022-07-14 DIAGNOSIS — Z833 Family history of diabetes mellitus: Secondary | ICD-10-CM | POA: Diagnosis not present

## 2022-07-14 DIAGNOSIS — M810 Age-related osteoporosis without current pathological fracture: Secondary | ICD-10-CM | POA: Diagnosis present

## 2022-07-14 DIAGNOSIS — Z8673 Personal history of transient ischemic attack (TIA), and cerebral infarction without residual deficits: Secondary | ICD-10-CM | POA: Diagnosis not present

## 2022-07-14 DIAGNOSIS — R251 Tremor, unspecified: Secondary | ICD-10-CM | POA: Diagnosis present

## 2022-07-14 DIAGNOSIS — E785 Hyperlipidemia, unspecified: Secondary | ICD-10-CM | POA: Diagnosis present

## 2022-07-14 DIAGNOSIS — Z7982 Long term (current) use of aspirin: Secondary | ICD-10-CM | POA: Diagnosis not present

## 2022-07-14 DIAGNOSIS — W5911XA Bitten by nonvenomous snake, initial encounter: Secondary | ICD-10-CM | POA: Diagnosis not present

## 2022-07-14 DIAGNOSIS — Z809 Family history of malignant neoplasm, unspecified: Secondary | ICD-10-CM | POA: Diagnosis not present

## 2022-07-14 DIAGNOSIS — Z23 Encounter for immunization: Secondary | ICD-10-CM | POA: Diagnosis present

## 2022-07-14 DIAGNOSIS — R471 Dysarthria and anarthria: Secondary | ICD-10-CM | POA: Diagnosis present

## 2022-07-14 DIAGNOSIS — F32A Depression, unspecified: Secondary | ICD-10-CM | POA: Diagnosis present

## 2022-07-14 DIAGNOSIS — Z8249 Family history of ischemic heart disease and other diseases of the circulatory system: Secondary | ICD-10-CM | POA: Diagnosis not present

## 2022-07-14 LAB — FIBRINOGEN: Fibrinogen: 294 mg/dL (ref 210–475)

## 2022-07-14 LAB — CBC
HCT: 39.8 % (ref 36.0–46.0)
Hemoglobin: 12.7 g/dL (ref 12.0–15.0)
MCH: 30.8 pg (ref 26.0–34.0)
MCHC: 31.9 g/dL (ref 30.0–36.0)
MCV: 96.4 fL (ref 80.0–100.0)
Platelets: 265 10*3/uL (ref 150–400)
RBC: 4.13 MIL/uL (ref 3.87–5.11)
RDW: 13.2 % (ref 11.5–15.5)
WBC: 8.4 10*3/uL (ref 4.0–10.5)
nRBC: 0 % (ref 0.0–0.2)

## 2022-07-14 LAB — PROTIME-INR
INR: 1.1 (ref 0.8–1.2)
Prothrombin Time: 13.9 seconds (ref 11.4–15.2)

## 2022-07-14 MED ORDER — ONDANSETRON HCL 4 MG/2ML IJ SOLN
4.0000 mg | Freq: Three times a day (TID) | INTRAMUSCULAR | Status: DC | PRN
  Administered 2022-07-14: 4 mg via INTRAVENOUS

## 2022-07-14 MED ORDER — ONDANSETRON 4 MG PO TBDP: 4.0000 mg | ORAL_TABLET | Freq: Three times a day (TID) | ORAL | Status: DC | PRN

## 2022-07-14 NOTE — Progress Notes (Addendum)
[facility] PROGRESS NOTE  Ileene Fleszar (DOB: [birth year]) WUJ:[id number] PCP: No primary care provider on file.  Brief Narrative: Brittney Miller is an 82 y.o. female with a history of osteoporosis, HLD, depression who presented to the ED on 07/13/2022 after being bitten by a snake in her garden on her right hand. CBC, PT/INR, and fibrinogen levels were reassuring. Poison control was contacted and following. We are measuring swelling serially, it has not peaked per measurements by RN this AM.   Subjective: Some numbness and tingling in her fingers but can move them with some pain. Pain in dorsal hand extending to wrist, but not much past that. No bleeding or bruising. Had vomiting then dry heaves this morning and urinary incontinence which is not her baseline. Feeling better with zofran.  Objective: BP (!) 101/55 (BP Location: Left Arm)   Pulse 84   Temp 98.2 F (36.8 C) (Oral)   Resp 19   Ht 4\' 10"  (1.473 m)   Wt 68 kg   LMP  (LMP Unknown)   SpO2 96%   BMI 31.35 kg/m   Gen: Elderly pleasant female in no acute distress Pulm: Clear, nonlabored  CV: RRR, no MRG or dependent edema GI: Soft, NT, ND, +BS  Neuro: Alert and oriented. No new focal deficits. Ext: Warm, dry. RUE with significant edema to dorsum of hand, minimally involving digits which can flex and extend actively with fair ROM. Radial pulses 2+, digits warm, intact cap refill. No epitrochlear adenopathy or axillary tenderness Skin: Tiny puncta on dorsum of RH, no other rashes, lesions or ulcers on visualized skin   Assessment & Plan: Snakes bites: 5/19 on dominant right hand near MCP. Received crofab per poison control.  - Hand surgery consulted, appreciate their evaluation. Pt has numbness/tingling and swelling but no evidence of compartment syndrome so no surgery required.  - Continue regular serial measurements/demarcation/monitoring.  - Tetanus immunization administered.  - Maintain splint and  elevation.  - Coags stable.   Dysarthria: At reported baseline.  - Outpatient neurology evaluation recommended.   Depression:  - Continue buspar, SSRI  Tyrone Nine, MD [facility] [email or web address] 07/14/2022, 1:28 PM

## 2022-07-14 NOTE — Progress Notes (Signed)
Arm Measurements are as follows: Hand: 21 cm Wrist: 17.5 cm Forearm: 18 cm Bicep: 23 cm No axillary tenderness present

## 2022-07-14 NOTE — Plan of Care (Signed)
  Problem: Education: Goal: Knowledge of General Education information will improve Description: Including pain rating scale, medication(s)/side effects and non-pharmacologic comfort measures 07/14/2022 0526 by Benny Lennert, RN Outcome: Progressing 07/14/2022 0526 by Benny Lennert, RN Outcome: Progressing   Problem: Health Behavior/Discharge Planning: Goal: Ability to manage health-related needs will improve 07/14/2022 0526 by Benny Lennert, RN Outcome: Progressing 07/14/2022 0526 by Benny Lennert, RN Outcome: Progressing   Problem: Clinical Measurements: Goal: Ability to maintain clinical measurements within normal limits will improve Outcome: Progressing Goal: Will remain free from infection Outcome: Progressing Goal: Diagnostic test results will improve Outcome: Progressing Goal: Respiratory complications will improve Outcome: Progressing Goal: Cardiovascular complication will be avoided Outcome: Progressing   Problem: Activity: Goal: Risk for activity intolerance will decrease Outcome: Progressing   Problem: Nutrition: Goal: Adequate nutrition will be maintained Outcome: Progressing   Problem: Coping: Goal: Level of anxiety will decrease Outcome: Progressing   Problem: Elimination: Goal: Will not experience complications related to bowel motility Outcome: Progressing Goal: Will not experience complications related to urinary retention Outcome: Progressing   Problem: Pain Managment: Goal: General experience of comfort will improve Outcome: Progressing   Problem: Safety: Goal: Ability to remain free from injury will improve Outcome: Progressing   Problem: Skin Integrity: Goal: Risk for impaired skin integrity will decrease Outcome: Progressing

## 2022-07-14 NOTE — Consult Note (Signed)
Reason for Consult:Right hand snake bite Referring Physician: Hazeline Junker Time called: 1048 Time at bedside: [address] Brittney Miller is an 82 y.o. female.  HPI: Brittney Miller was bitten by a snake on the right hand yesterday. The presumption was it was a copperhead. She came to the ED and was admitted. Hand surgery was consulted the following morning. She is RHD and lives at home with her daughter.  Past Medical History:  Diagnosis Date   Anxiety    Arthritis    Cataract    Chronic kidney disease    Dyslipidemia     Past Surgical History:  Procedure Laterality Date   Cataract surgery     CHOLECYSTECTOMY     COLONOSCOPY     TONSILLECTOMY     XI ROBOT ASSISTED RECTOPEXY N/A 04/15/2019   Procedure: XI ROBOT ASSISTED RECTOPEXY;  Surgeon: Romie Levee, MD;  Location: [facility];  Service: General;  Laterality: N/A;    Family History  Problem Relation Age of Onset   Hypertension Mother    Diabetes Father    Breast cancer Maternal Grandmother    Cancer Daughter        With brain metastasis   Brain cancer Maternal Uncle        Unsure if brain mets or primary neurologic trauma   Colon cancer Neg Hx    Esophageal cancer Neg Hx    Pancreatic cancer Neg Hx    Stomach cancer Neg Hx     Social History:  reports that she has never smoked. She has never used smokeless tobacco. She reports current alcohol use. She reports that she does not use drugs.  Allergies: No Known Allergies  Medications: I have reviewed the patient's current medications.  Results for orders placed or performed during the hospital encounter of 07/13/22 (from the past 48 hour(s))  CBC WITH DIFFERENTIAL     Status: None   Collection Time: 07/13/22  5:01 PM  Result Value Ref Range   WBC 6.1 4.0 - 10.5 K/uL   RBC 4.23 3.87 - 5.11 MIL/uL   Hemoglobin 13.0 12.0 - 15.0 g/dL   HCT 16.1 09.6 - 04.5 %   MCV 92.9 80.0 - 100.0 fL   MCH 30.7 26.0 - 34.0 pg   MCHC 33.1 30.0 - 36.0 g/dL   RDW 40.9 81.1 - 91.4 %    Platelets 292 150 - 400 K/uL   nRBC 0.0 0.0 - 0.2 %   Neutrophils Relative % 53 %   Neutro Abs 3.2 1.7 - 7.7 K/uL   Lymphocytes Relative 34 %   Lymphs Abs 2.1 0.7 - 4.0 K/uL   Monocytes Relative 9 %   Monocytes Absolute 0.5 0.1 - 1.0 K/uL   Eosinophils Relative 3 %   Eosinophils Absolute 0.2 0.0 - 0.5 K/uL   Basophils Relative 1 %   Basophils Absolute 0.1 0.0 - 0.1 K/uL   Immature Granulocytes 0 %   Abs Immature Granulocytes 0.01 0.00 - 0.07 K/uL    Comment: Performed at [facility], [address] [place], [place] [address]  Protime-INR     Status: None   Collection Time: 07/13/22  5:01 PM  Result Value Ref Range   Prothrombin Time 13.3 11.4 - 15.2 seconds   INR 1.0 0.8 - 1.2    Comment: (NOTE) INR goal varies based on device and disease states. Performed at [facility], [address], [place], [place] [address]   Fibrinogen  Status: None   Collection Time: 07/13/22  5:01 PM  Result Value Ref Range   Fibrinogen 324 210 - 475 mg/dL    Comment: (NOTE) Fibrinogen results may be underestimated in patients receiving thrombolytic therapy. Performed at [facility], [address] [place], [place] [address]   APTT     Status: None   Collection Time: 07/13/22  5:01 PM  Result Value Ref Range   aPTT 27 24 - 36 seconds    Comment: Performed at [facility], [address] [place], [place] [address]  CK     Status: None   Collection Time: 07/13/22  5:01 PM  Result Value Ref Range   Total CK 99 38 - 234 U/L    Comment: Performed at [facility], [address], [place], [place] [address]  Protime-INR     Status: Abnormal   Collection Time: 07/13/22  8:05 PM  Result Value Ref Range   Prothrombin Time 18.2 (H) 11.4 - 15.2 seconds   INR 1.5 (H) 0.8 - 1.2    Comment: (NOTE) INR goal varies based on device and disease states. Performed at [facility], [address] [place], [place] [address]   Fibrinogen     Status: Abnormal   Collection Time: 07/13/22  8:05 PM  Result Value Ref Range   Fibrinogen 157 (L) 210 - 475 mg/dL    Comment: (NOTE) Fibrinogen results may be underestimated in patients receiving thrombolytic therapy. Performed at [facility], [address] [place], [place] [address]   CBC with Differential/Platelet     Status: None   Collection Time: 07/13/22  8:34 PM  Result Value Ref Range   WBC 7.8 4.0 - 10.5 K/uL   RBC 4.05 3.87 - 5.11 MIL/uL   Hemoglobin 12.4 12.0 - 15.0 g/dL   HCT 13.0 86.5 - 78.4 %   MCV 94.6 80.0 - 100.0 fL   MCH 30.6 26.0 - 34.0 pg   MCHC 32.4 30.0 - 36.0 g/dL   RDW 69.6 29.5 - 28.4 %   Platelets 280 150 - 400 K/uL   nRBC 0.0 0.0 - 0.2 %   Neutrophils Relative % 68 %   Neutro Abs 5.4 1.7 - 7.7 K/uL   Lymphocytes Relative 22 %   Lymphs Abs 1.7 0.7 - 4.0 K/uL   Monocytes Relative 7 %   Monocytes Absolute 0.6 0.1 - 1.0 K/uL   Eosinophils Relative 2 %   Eosinophils Absolute 0.1 0.0 - 0.5 K/uL   Basophils Relative 1 %   Basophils Absolute 0.1 0.0 - 0.1 K/uL   Immature Granulocytes 0 %   Abs Immature Granulocytes 0.01 0.00 - 0.07 K/uL    Comment: Performed at [facility], [address] [place], [place] [address]  CBC     Status: None   Collection Time: 07/14/22  5:18 AM  Result Value Ref Range   WBC 8.4 4.0 - 10.5 K/uL   RBC 4.13 3.87 - 5.11 MIL/uL   Hemoglobin 12.7 12.0 - 15.0 g/dL   HCT 01.0 27.2 - 53.6 %   MCV 96.4 80.0 - 100.0 fL   MCH 30.8 26.0 - 34.0 pg   MCHC 31.9 30.0 - 36.0 g/dL   RDW 64.4 03.4 - 74.2 %   Platelets 265 150 - 400 K/uL   nRBC 0.0 0.0 - 0.2 %    Comment: Performed at [facility], [address] [place],  [place] [address]  Fibrinogen     Status: None   Collection Time: 07/14/22  5:18 AM  Result Value Ref Range   Fibrinogen 294 210 - 475 mg/dL    Comment: (NOTE) Fibrinogen results may be  underestimated in patients receiving thrombolytic therapy. Performed at [facility], [address], [place], [place] [address]   Protime-INR     Status: None   Collection Time: 07/14/22  5:18 AM  Result Value Ref Range   Prothrombin Time 13.9 11.4 - 15.2 seconds   INR 1.1 0.8 - 1.2    Comment: (NOTE) INR goal varies based on device and disease states. Performed at [facility], [address], [place], [place] [address]     No results found.  Review of Systems  HENT:  Negative for ear discharge, ear pain, hearing loss and tinnitus.   Eyes:  Negative for photophobia and pain.  Respiratory:  Negative for cough and shortness of breath.   Cardiovascular:  Negative for chest pain.  Gastrointestinal:  Negative for abdominal pain, nausea and vomiting.  Genitourinary:  Negative for dysuria, flank pain, frequency and urgency.  Musculoskeletal:  Positive for arthralgias (Right wrist/hand). Negative for back pain, myalgias and neck pain.  Neurological:  Negative for dizziness and headaches.  Hematological:  Does not bruise/bleed easily.  Psychiatric/Behavioral:  The patient is not nervous/anxious.    Blood pressure (!) 144/71, pulse 64, temperature 98 F (36.7 C), temperature source Oral, resp. rate 19, height 4\' 10"  (1.473 m), weight 68 kg, SpO2 96 %. Physical Exam Constitutional:      General: She is not in acute distress.    Appearance: She is well-developed. She is not diaphoretic.  HENT:     Head: Normocephalic and atraumatic.  Eyes:     General: No scleral icterus.       Right eye: No discharge.        Left eye: No discharge.     Conjunctiva/sclera: Conjunctivae normal.  Cardiovascular:     Rate and Rhythm: Normal rate and regular rhythm.  Pulmonary:     Effort: Pulmonary effort is normal. No respiratory distress.  Musculoskeletal:     Cervical back: Normal range of motion.     Comments: Right shoulder, elbow, wrist, digits- no skin  wounds, mild/mod TTP, no instability, no blocks to motion  Sens  Ax/R/M/U intact  Mot   Ax/ R/ PIN/ M/ AIN/ U intact  Rad 2+  Skin:    General: Skin is warm and dry.  Neurological:     Mental Status: She is alert.  Psychiatric:        Mood and Affect: Mood normal.        Behavior: Behavior normal.     Assessment/Plan: Right hand snake bite -- No s/sx of compartment syndrome which is the only thing that would make this a surgical issue. Will give a removable wrist splint for comfort and a sling arm elevator for the swelling. No need for hand surgery f/u.    Freeman Caldron, PA-C Orthopedic Surgery [phone] 07/14/2022, 1:02 PM

## 2022-07-14 NOTE — Progress Notes (Signed)
  Transition of Care [facility]) Screening Note   Patient Details  Name: Brittney Miller Date of Birth: July 27, 1940   Transition of Care [facility]) CM/SW Contact:    Otelia Santee, LCSW Phone Number: 07/14/2022, 1:36 PM    Transition of Care Department [facility]) has reviewed patient and no TOC needs have been identified at this time. We will continue to monitor patient advancement through interdisciplinary progression rounds. If new patient transition needs arise, please place a TOC consult.

## 2022-07-14 NOTE — Progress Notes (Signed)
Orthopedic Tech Progress Note Patient Details:  Brittney Miller [birth year] [id number]  Ortho Devices Type of Ortho Device: Sling arm elevator, Velcro wrist forearm splint Ortho Device/Splint Location: RUE Ortho Device/Splint Interventions: Ordered, Application, Adjustment   Post Interventions Patient Tolerated: Well Instructions Provided: Care of device, Adjustment of device Wrist splint applied and arm elevator set up to be used when the patient returns back to bed. [place] A Gerilyn Pilgrim 07/14/2022, 1:35 PM

## 2022-07-14 NOTE — Progress Notes (Signed)
Mobility Specialist - Progress Note   07/14/22 1117  Mobility  Activity Ambulated with assistance in hallway  Level of Assistance Contact guard assist, steadying assist  Assistive Device Front wheel walker  Distance Ambulated (ft) 200 ft  Range of Motion/Exercises Active  Activity Response Tolerated well  Mobility Referral Yes  $Mobility charge 1 Mobility  Mobility Specialist Start Time (ACUTE ONLY) 1105  Mobility Specialist Stop Time (ACUTE ONLY) 1116  Mobility Specialist Time Calculation (min) (ACUTE ONLY) 11 min   Pt received in bed and agreed to mobility. No c/o pain nor discomfort during session. Pt returned to chair with all needs met and alarm on.  Marilynne Halsted Mobility Specialist

## 2022-07-15 DIAGNOSIS — F32A Depression, unspecified: Secondary | ICD-10-CM | POA: Diagnosis not present

## 2022-07-15 DIAGNOSIS — R471 Dysarthria and anarthria: Secondary | ICD-10-CM | POA: Diagnosis not present

## 2022-07-15 DIAGNOSIS — W5911XA Bitten by nonvenomous snake, initial encounter: Secondary | ICD-10-CM | POA: Diagnosis not present

## 2022-07-15 MED ORDER — ONDANSETRON 4 MG PO TBDP: 4.0000 mg | ORAL_TABLET | Freq: Three times a day (TID) | ORAL | 0 refills | Status: AC | PRN

## 2022-07-15 MED ORDER — FUROSEMIDE 20 MG PO TABS: 20.0000 mg | ORAL_TABLET | Freq: Once | ORAL | Status: DC

## 2022-07-15 NOTE — Discharge Summary (Signed)
Physician Discharge Summary   Patient: Brittney Miller MRN: [id number] DOB: 10-21-1940  Admit date:     07/13/2022  Discharge date: 07/15/22  Discharge Physician: Tyrone Nine   PCP: No primary care provider on file.   Recommendations at discharge:  Establish care with PCP. CM consulted for this and to arrange home health OT.  Keep RUE elevated, return precautions discussed.  Consider outpatient neurology evaluation for dysarthria which is chronic and unchanged currently.  Discharge Diagnoses: Principal Problem:   Snake bites, initial encounter Active Problems:   Depression   Tremor   Dysarthria  Hospital Course: Brittney Miller is an 82 y.o. female with a history of osteoporosis, HLD, depression who presented to the ED on 07/13/2022 after being bitten by a snake in her garden on her right hand. CBC, PT/INR, and fibrinogen levels were reassuring. Poison control was contacted and following. Swelling has improved and pain subsided. She remains hemodynamically stable for discharge on 5/21 with plans to establish PCP care after discharge. Please see pertinent details below.  Assessment and Plan: Snakes bites: 5/19 on dominant right hand near MCP. Received crofab per poison control. Hand surgery consulted, appreciate their evaluation. No evidence of compartment syndrome so no surgery required. Symptoms have significantly improved. Labs completely stable.  - Keep elevated, return precautions discussed.  - Tetanus immunization administered.    Dysarthria: At reported baseline.  - Outpatient neurology evaluation recommended.    Depression:  - Continue buspar, SSRI  LUE swelling: Related to IVF running thru PIV at 125cc/hr >24hours, painless. DC IVF, give a dose of her home prn lasix 20mg  and elevate extremity.   Consultants: Hand surgery Procedures performed: None  Disposition: Home Diet recommendation:  Regular diet DISCHARGE MEDICATION: Allergies as of 07/15/2022    No Known Allergies      Medication List     TAKE these medications    busPIRone 15 MG tablet Commonly known as: BUSPAR Take 15 mg by mouth 2 (two) times daily.   FLUoxetine 40 MG capsule Commonly known as: PROZAC Take 40 mg by mouth daily.   furosemide 20 MG tablet Commonly known as: LASIX TAKE 1 TABLET BY MOUTH EVERY DAY AS NEEDED   ondansetron 4 MG disintegrating tablet Commonly known as: ZOFRAN-ODT Take 1 tablet (4 mg total) by mouth every 8 (eight) hours as needed for nausea or vomiting.        Discharge Exam: Filed Weights   07/13/22 1650  Weight: 68 kg  BP 128/75   Pulse (!) 57   Temp 98.4 F (36.9 C) (Oral)   Resp 18   Ht 4\' 10"  (1.473 m)   Wt 68 kg   LMP  (LMP Unknown)   SpO2 96%   BMI 31.35 kg/m   Elderly female in no distress R hand with improved edema, not appreciably tender. She reports no numbness or tingling. Moves all digits and wrist and elbow with intact strength and no tenderness at joints.  Clear, nonlabored Trace pitting dependent edema, 1+ pitting LUE edema in hand distal to PIV. No tenderness, no erythema.  Condition at discharge: stable  The results of significant diagnostics from this hospitalization (including imaging, microbiology, ancillary and laboratory) are listed below for reference.   Imaging Studies: No results found.  Microbiology: Results for orders placed or performed during the hospital encounter of 04/12/19  SARS CORONAVIRUS 2 (TAT 6-24 HRS) Nasopharyngeal Nasopharyngeal Swab     Status: None   Collection Time: 04/12/19 10:30 AM  Specimen: Nasopharyngeal Swab  Result Value Ref Range Status   SARS Coronavirus 2 NEGATIVE NEGATIVE Final    Comment: (NOTE) SARS-CoV-2 target nucleic acids are NOT DETECTED. The SARS-CoV-2 RNA is generally detectable in upper and lower respiratory specimens during the acute phase of infection. Negative results do not preclude SARS-CoV-2 infection, do not rule out co-infections with  other pathogens, and should not be used as the sole basis for treatment or other patient management decisions. Negative results must be combined with clinical observations, patient history, and epidemiological information. The expected result is Negative. Fact Sheet for Patients: HairSlick.no Fact Sheet for Healthcare Providers: quierodirigir.com This test is not yet approved or cleared by the [place] FDA and  has been authorized for detection and/or diagnosis of SARS-CoV-2 by FDA under an Emergency Use Authorization (EUA). This EUA will remain  in effect (meaning this test can be used) for the duration of the COVID-19 declaration under Section 56 4(b)(1) of the Act, 21 U.S.C. section 360bbb-3(b)(1), unless the authorization is terminated or revoked sooner. Performed at [facility], [address] [place], [place] [address]     Labs: CBC: Recent Labs  Lab 07/13/22 1701 07/13/22 2034 07/14/22 0518  WBC 6.1 7.8 8.4  NEUTROABS 3.2 5.4  --   HGB 13.0 12.4 12.7  HCT 39.3 38.3 39.8  MCV 92.9 94.6 96.4  PLT 292 280 265   Basic Metabolic Panel: No results for input(s): "NA", "K", "CL", "CO2", "GLUCOSE", "BUN", "CREATININE", "CALCIUM", "MG", "PHOS" in the last 168 hours. Liver Function Tests: No results for input(s): "AST", "ALT", "ALKPHOS", "BILITOT", "PROT", "ALBUMIN" in the last 168 hours. CBG: No results for input(s): "GLUCAP" in the last 168 hours.  Discharge time spent: greater than 30 minutes.  Signed: Tyrone Nine, MD [facility] 07/15/2022

## 2022-07-15 NOTE — TOC Transition Note (Signed)
Transition of Care [facility]) - CM/SW Discharge Note   Patient Details  Name: Brittney Miller MRN: [id number] Date of Birth: 04-17-1940  Transition of Care [facility]) CM/SW Contact:  Otelia Santee, LCSW Phone Number: 07/15/2022, 10:10 AM   Clinical Narrative:    Met with pt to discuss need for PCP. CSW encouraged pt to call her insurance provider to determine what primary care offices are in network with her insurance. CSW left voicemail for pt's daughter relaying the same. No further TOC needs identified.    Final next level of care: Home/Self Care Barriers to Discharge: No Barriers Identified   Patient Goals and CMS Choice CMS Medicare.gov Compare Post Acute Care list provided to:: Patient Choice offered to / list presented to : Patient  Discharge Placement                         Discharge Plan and Services Additional resources added to the After Visit Summary for                  DME Arranged: N/A DME Agency: NA                  Social Determinants of Health (SDOH) Interventions SDOH Screenings   Food Insecurity: No Food Insecurity (07/13/2022)  Housing: Low Risk  (07/13/2022)  Transportation Needs: No Transportation Needs (07/13/2022)  Utilities: Not At Risk (07/13/2022)  Tobacco Use: Low Risk  (07/13/2022)     Readmission Risk Interventions    07/15/2022   10:09 AM  Readmission Risk Prevention Plan  Post Dischage Appt Not Complete  Appt Comments Pt/daughter to call to establish primary care  Medication Screening Complete  Transportation Screening Complete

## 2022-10-23 ENCOUNTER — Other Ambulatory Visit: Payer: Self-pay | Admitting: Obstetrics & Gynecology

## 2022-10-23 DIAGNOSIS — Z1231 Encounter for screening mammogram for malignant neoplasm of breast: Secondary | ICD-10-CM

## 2022-11-18 ENCOUNTER — Ambulatory Visit: Payer: PRIVATE HEALTH INSURANCE

## 2022-11-26 ENCOUNTER — Ambulatory Visit: Payer: PRIVATE HEALTH INSURANCE

## 2023-06-15 ENCOUNTER — Encounter: Payer: Self-pay | Admitting: Neurology

## 2023-06-15 ENCOUNTER — Ambulatory Visit: Payer: Medicare Other | Admitting: Neurology

## 2023-11-19 NOTE — Progress Notes (Signed)
 Assessment/Plan:   1.  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  We discussed that we could do further testing, including PET scan and updating MRI brain, but that would only help to confirm our diagnosis and discussed that there is no good treatment.  We discussed speech/language therapy but ultimately patient/friend did not think that that would be particularly beneficial/useful and did not want to proceed.  We did discuss strategies to help her communicate better with her friends and family such as writing, and words down and pointing to them.  - I did express some concern about her current living situation.  Her daughter apparently lives with her, but her daughter does not participate in caregiving; quite the opposite is true apparently.  I do not think that the patient should be living and caregiving for herself and think that she needs someone with her that can assist.  Her friend states that her son does live in town and is able to assist.  I do think that they need higher level of care within the home.  2.  B12 deficiency  -pt with evidence of B12 deficiency.  Discussed with the patient that neurologically, would like to see B12 levels greater than 400.  Hers was 235.  Would recommend oral B12 supplementation, 1000 mcg daily.   Subjective:   Brittney Miller was seen today in the movement disorders clinic for neurologic consultation at the request of Elliot Charm,*.  The consultation is for the evaluation of resting tremor and dysarthria.  Pt is with her friend who supplements the history (however, neither pt nor her friend knows her medications and those were derived from referral and from epic dispense report).  Medical records made available to me are reviewed.  Medical records indicate that this has been present for 8 years, but there has  been some question on whether a snake bite a year ago caused this.  She was referred to neurology [facility] neurology) in November, 2024 but they were not able to get a hold of her to schedule the appointment.  Current primary care records also indicate that she had a history of stroke, but they had no records about the nature or etiology of the stroke as she was not cared for by them or in our system.  Pt and friend (who has been her friend for 70 years) deny hx of stroke.   Specific Symptoms:  Tremor: Yes.  , pt states that she has had tremor for a long time but has had trouble defining this.  She thinks that she was pretty good 15 years ago.  She is R handed but both shake some.  Pt states several times she has had tremor all her life but her friend denies this.   Family hx of similar:  mom had a little bit of tremor in her later years Postural symptoms:  she walks with a cane.  Has had bad knees for many years  Falls?  None for 3 years and I just collapsed - hard to say if she passed out or not.  I don't do it now Difficulty Swallowing:  No. Trouble with ADL's:  No. Memory changes:  daughter lives with pt (but it sounds like daughter has a mental condition and she is not helping to caregive and friend states that daughter is paranoid and has hallucinations).  Pt states that she pays bills.  Daughter buys groceries.  Pt doesn't cook much - might heat something up.  Pt doesn't drive - quit a year ago because dr told her not to drive Hallucinations:  No., not really N/V:  No. Lightheaded:  No.  Syncope: No. Diplopia:  No.   Patient had an MRI of the brain done November 13, 2021.  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  There is mild small vessel disease.  I personally reviewed the films.   ALLERGIES:  No Known Allergies  CURRENT MEDICATIONS:  Current Meds  Medication Sig   busPIRone  (BUSPAR ) 15 MG tablet Take 15 mg by mouth 2 (two) times daily.   FLUoxetine   (PROZAC ) 40 MG capsule Take 40 mg by mouth daily.    ondansetron  (ZOFRAN -ODT) 4 MG disintegrating tablet Take 1 tablet (4 mg total) by mouth every 8 (eight) hours as needed for nausea or vomiting.     Objective:   VITALS:   Vitals:   11/24/23 1324  BP: 118/68  Pulse: 69  SpO2: 98%  Weight: 129 lb 6.4 oz (58.7 kg)    GEN:  The patient appears stated age and is in NAD. HEENT:  Normocephalic, atraumatic.  The mucous membranes are moist. The superficial temporal arteries are without ropiness or tenderness. CV:  RRR Lungs:  CTAB Neck/HEME:  There are no carotid bruits bilaterally.  Neurological examination:  Orientation: The patient is alert and oriented x3.  Cranial nerves: There is good facial symmetry. Extraocular muscles are intact. The visual fields are full to confrontational testing. The speech is nonfluent with dysphasia and paraphasic errors.  She has significant trouble naming.  When shown a watch, she calls it time.  Soft palate rises symmetrically and there is no tongue deviation. Hearing is intact to conversational tone. Sensation: Sensation is intact to light and pinprick throughout (facial, trunk, extremities). Vibration is intact at the bilateral ankle. There is no extinction with double simultaneous stimulation. There is no sensory dermatomal level identified.  When a pen is put into her hand with eyes closed, she was able to identify the object correctly as a pen.  When a battery was placed into her hand with eyes closed, she was not able to identify the battery with the eyes open or closed, but I do not think that this was astereognosis as much as anomia/dysnomia and eventually she told me it was used in a flashlight. Motor: She had some difficulty with manual motor testing/apraxia.  She had good grip strength bilaterally.  Strength was at least 4/5 in the bilateral upper and lower extremities, with apraxia with manual motor testing.  There were no fasciculations noted in  arms or legs or tongue. Deep tendon reflexes: Deep tendon reflexes are 2/4 at the bilateral biceps, triceps, brachioradialis, 1/4 at the bilateral patella and achilles. Plantar responses are downgoing bilaterally.  Movement examination: Tone: There is normal tone in the bilateral upper extremities.  The tone in the lower extremities is normal.  Abnormal movements: She had irregular tremor that can be felt in the arms at rest and with posture. Coordination:  There is no decremation with RAM's Gait and Station: The patient pushes off to arise.  She is flexed at the waist, but much of this is orthopedic.  She has valgus deformity of the knees.  She ambulates with a cane.  She is just mildly unstable. I have reviewed and interpreted the following labs independently   Chemistry  Component Value Date/Time   NA 134 (L) 04/16/2019 0428   K 3.6 04/16/2019 0428   CL 101 04/16/2019 0428   CO2 23 04/16/2019 0428   BUN 15 04/16/2019 0428   CREATININE 0.90 04/16/2019 0428      Component Value Date/Time   CALCIUM 8.2 (L) 04/16/2019 0428   ALKPHOS 71 04/01/2019 0039   AST 21 04/01/2019 0039   ALT 15 04/01/2019 0039   BILITOT 1.0 04/01/2019 0039      Lab Results  Component Value Date   TSH 2.23 05/10/2018   Lab Results  Component Value Date   WBC 8.4 07/14/2022   HGB 12.7 07/14/2022   HCT 39.8 07/14/2022   MCV 96.4 07/14/2022   PLT 265 07/14/2022   Patient had more recent lab work with primary care on October 14, 2023.  Her TSH was 2.72.  B12 was low at 235.  Sedimentation rate was 11.  Total time spent on today's visit was 60 minutes, including both face-to-face time and nonface-to-face time.  Time included that spent on review of records (prior notes available to me/labs/imaging if pertinent), discussing treatment and goals, answering patient's questions and coordinating care.  Cc:  Elliot Charm, MD

## 2023-11-24 ENCOUNTER — Encounter: Payer: Self-pay | Admitting: Neurology

## 2023-11-24 ENCOUNTER — Ambulatory Visit (INDEPENDENT_AMBULATORY_CARE_PROVIDER_SITE_OTHER): Payer: PRIVATE HEALTH INSURANCE | Admitting: Neurology

## 2023-11-24 VITALS — BP 118/68 | HR 69 | Wt 129.4 lb

## 2023-11-24 DIAGNOSIS — E538 Deficiency of other specified B group vitamins: Secondary | ICD-10-CM

## 2023-11-24 DIAGNOSIS — F0280 Dementia in other diseases classified elsewhere without behavioral disturbance: Secondary | ICD-10-CM

## 2023-11-24 DIAGNOSIS — G3101 Pick's disease: Secondary | ICD-10-CM

## 2023-11-24 NOTE — Patient Instructions (Addendum)
  You have been diagnosed with low B12.  We recommend that you take over the counter B12, 1000 micrograms daily.   Understanding Nonfluent/Agrammatic Primary Progressive Aphasia (nfvPPA) Nonfluent/agrammatic primary progressive aphasia (nfvPPA) is a condition that causes gradual difficulty with speaking and forming sentences. This happens because certain areas of the brain that control language slowly change over time. People with nfvPPA may find it hard to speak clearly, use correct grammar, or say words in the right order. Reading and writing can also become challenging.[1][2] What to Expect - Speech may become slow or effortful. - Sentences may be short or missing words. - It can be hard to find the right words or say them correctly. - Understanding complex sentences may also be difficult. How Can Speech and Language Therapy Help? Speech and language therapy is the main way to help manage nfvPPA. Therapists work with you to find the best ways to communicate and keep your language skills as strong as possible. Therapy is tailored to your needs and may include: - Practicing speaking with simple, everyday scripts (short conversations about familiar topics).[3] - Using video-based activities or online programs to practice speech and language at home.[4] - Learning strategies to help you get your message across, such as using gestures, writing, or drawing.[5][6] - Working with family and friends to help them support your communication.[6][7] Tips for Everyday Communication - Speak slowly and use short sentences. - If you get stuck, try using gestures or writing down key words. - Use pictures or objects to help explain what you mean. - Practice important phrases or conversations with your therapist. - Ask your family or friends to give you time to express yourself and to use simple language. Partner and Family Support Family and friends can help by: - Being patient and giving extra time for you  to speak. - Asking yes/no questions when possible. - Using visual aids, like photos or written words. - Attending therapy sessions to learn helpful strategies.[6][7] Technology and Tools - Smartphones and tablets can be used for speech practice and reminders. - Video-based speech activities can be done at home to keep practicing.[4] - Apps and communication boards may help you express yourself. Staying Positive and Connected - Focus on activities and hobbies you enjoy. - Stay socially active with friends and family. - Let your therapist know what is most important to you, so therapy can be personalized.[5][8] Remember There is no cure for nfvPPA, but therapy and support can help you communicate better and maintain your quality of life. Regular practice and teamwork with your therapist and loved ones make a big difference.[3][4][5][6][7][8][9][10] If you have questions or concerns, talk to your speech-language therapist or healthcare team.

## 2024-05-24 ENCOUNTER — Ambulatory Visit: Payer: PRIVATE HEALTH INSURANCE | Admitting: Physician Assistant
